# Patient Record
Sex: Female | Born: 1940 | Race: Black or African American | Hispanic: No | Smoking: Former smoker
Health system: Southern US, Community
[De-identification: ages and names within clinical notes are randomized; demographics above are authoritative.]

## PROBLEM LIST (undated history)

## (undated) DIAGNOSIS — F0390 Unspecified dementia without behavioral disturbance: Secondary | ICD-10-CM

## (undated) DIAGNOSIS — I10 Essential (primary) hypertension: Secondary | ICD-10-CM

---

## 2004-03-26 ENCOUNTER — Other Ambulatory Visit: Payer: Self-pay

## 2004-03-26 ENCOUNTER — Inpatient Hospital Stay: Payer: Self-pay | Admitting: Internal Medicine

## 2004-07-28 ENCOUNTER — Ambulatory Visit: Payer: Self-pay | Admitting: Unknown Physician Specialty

## 2004-08-08 ENCOUNTER — Ambulatory Visit: Payer: Self-pay | Admitting: Unknown Physician Specialty

## 2005-10-02 ENCOUNTER — Emergency Department: Payer: Self-pay | Admitting: Emergency Medicine

## 2006-03-01 ENCOUNTER — Ambulatory Visit: Payer: Self-pay | Admitting: Unknown Physician Specialty

## 2007-06-06 ENCOUNTER — Ambulatory Visit: Payer: Self-pay | Admitting: Unknown Physician Specialty

## 2008-11-30 ENCOUNTER — Ambulatory Visit: Payer: Self-pay | Admitting: Unknown Physician Specialty

## 2008-12-04 ENCOUNTER — Ambulatory Visit: Payer: Self-pay | Admitting: Unknown Physician Specialty

## 2008-12-21 ENCOUNTER — Ambulatory Visit: Payer: Self-pay | Admitting: Unknown Physician Specialty

## 2009-01-03 ENCOUNTER — Ambulatory Visit: Payer: Self-pay | Admitting: Unknown Physician Specialty

## 2009-07-03 ENCOUNTER — Ambulatory Visit: Payer: Self-pay | Admitting: Unknown Physician Specialty

## 2009-07-23 ENCOUNTER — Ambulatory Visit: Payer: Self-pay | Admitting: Surgery

## 2010-01-22 ENCOUNTER — Ambulatory Visit: Payer: Self-pay | Admitting: Surgery

## 2011-03-18 ENCOUNTER — Ambulatory Visit: Payer: Self-pay | Admitting: Unknown Physician Specialty

## 2011-08-20 ENCOUNTER — Ambulatory Visit: Payer: Self-pay | Admitting: Unknown Physician Specialty

## 2012-05-02 ENCOUNTER — Ambulatory Visit: Payer: Self-pay | Admitting: Physician Assistant

## 2012-07-22 ENCOUNTER — Ambulatory Visit: Payer: Self-pay | Admitting: Unknown Physician Specialty

## 2012-07-25 LAB — PATHOLOGY REPORT

## 2015-08-08 ENCOUNTER — Other Ambulatory Visit: Payer: Self-pay | Admitting: Family Medicine

## 2015-08-08 DIAGNOSIS — Z1231 Encounter for screening mammogram for malignant neoplasm of breast: Secondary | ICD-10-CM

## 2015-10-02 ENCOUNTER — Encounter: Payer: Self-pay | Admitting: Emergency Medicine

## 2015-10-02 ENCOUNTER — Emergency Department
Admission: EM | Admit: 2015-10-02 | Discharge: 2015-10-02 | Disposition: A | Discharge status: Home / Self Care | Payer: Medicare HMO | Attending: Emergency Medicine | Admitting: Emergency Medicine

## 2015-10-02 ENCOUNTER — Emergency Department: Payer: Medicare HMO

## 2015-10-02 DIAGNOSIS — R55 Syncope and collapse: Secondary | ICD-10-CM

## 2015-10-02 DIAGNOSIS — Z8679 Personal history of other diseases of the circulatory system: Secondary | ICD-10-CM | POA: Insufficient documentation

## 2015-10-02 DIAGNOSIS — Z7982 Long term (current) use of aspirin: Secondary | ICD-10-CM | POA: Diagnosis not present

## 2015-10-02 DIAGNOSIS — Z79899 Other long term (current) drug therapy: Secondary | ICD-10-CM | POA: Diagnosis not present

## 2015-10-02 DIAGNOSIS — I951 Orthostatic hypotension: Secondary | ICD-10-CM | POA: Insufficient documentation

## 2015-10-02 HISTORY — DX: Unspecified dementia, unspecified severity, without behavioral disturbance, psychotic disturbance, mood disturbance, and anxiety: F03.90

## 2015-10-02 HISTORY — DX: Essential (primary) hypertension: I10

## 2015-10-02 LAB — CBC
HEMATOCRIT: 40.3 % (ref 35.0–47.0)
Hemoglobin: 13.1 g/dL (ref 12.0–16.0)
MCH: 29.2 pg (ref 26.0–34.0)
MCHC: 32.4 g/dL (ref 32.0–36.0)
MCV: 90 fL (ref 80.0–100.0)
Platelets: 170 10*3/uL (ref 150–440)
RBC: 4.48 MIL/uL (ref 3.80–5.20)
RDW: 14.8 % — ABNORMAL HIGH (ref 11.5–14.5)
WBC: 6.6 10*3/uL (ref 3.6–11.0)

## 2015-10-02 LAB — URINALYSIS COMPLETE WITH MICROSCOPIC (ARMC ONLY)
BACTERIA UA: NONE SEEN
Bilirubin Urine: NEGATIVE
Glucose, UA: NEGATIVE mg/dL
HGB URINE DIPSTICK: NEGATIVE
Ketones, ur: NEGATIVE mg/dL
Nitrite: NEGATIVE
Protein, ur: 30 mg/dL — AB
SPECIFIC GRAVITY, URINE: 1.025 (ref 1.005–1.030)
pH: 5 (ref 5.0–8.0)

## 2015-10-02 LAB — BASIC METABOLIC PANEL
Anion gap: 7 (ref 5–15)
BUN: 27 mg/dL — ABNORMAL HIGH (ref 6–20)
CHLORIDE: 107 mmol/L (ref 101–111)
CO2: 23 mmol/L (ref 22–32)
Calcium: 8.9 mg/dL (ref 8.9–10.3)
Creatinine, Ser: 1.36 mg/dL — ABNORMAL HIGH (ref 0.44–1.00)
GFR calc non Af Amer: 38 mL/min — ABNORMAL LOW (ref >60)
GFR, EST AFRICAN AMERICAN: 44 mL/min — AB (ref >60)
Glucose, Bld: 118 mg/dL — ABNORMAL HIGH (ref 65–99)
POTASSIUM: 4.3 mmol/L (ref 3.5–5.1)
SODIUM: 137 mmol/L (ref 135–145)

## 2015-10-02 LAB — TROPONIN I: Troponin I: <0.03 ng/mL (ref <0.031)

## 2015-10-02 MED ORDER — SODIUM CHLORIDE 0.9 % IV SOLN
Freq: Once | INTRAVENOUS | Status: AC
  Administered 2015-10-02: 17:00:00 via INTRAVENOUS

## 2015-10-02 NOTE — ED Notes (Signed)
Patient presents to the ED with dizziness and syncopal episode. Patient states she has had issues with dizziness for several months.  Patient states earlier today she felt like she was going to pass out and she attempted to sit down, missed the chair and fell in the floor.  Patient states she did pass out today.  Patient's daughter heard a noise, entered the room and patient was lying on the floor.   Patient has had increased confusion over the past few weeks and has been falling more recently.  Patient has history of early stages of dementia.  Dr. Sherryll BurgerShah is patient's neurologist.

## 2015-10-02 NOTE — Discharge Instructions (Signed)
Orthostatic Hypotension °Orthostatic hypotension is a sudden drop in blood pressure. It happens when you quickly stand up from a seated or lying position. You may feel dizzy or light-headed. This can last for just a few seconds or for up to a few minutes. It is usually not a serious problem. However, if this happens frequently or gets worse, it can be a sign of something more serious. °CAUSES  °Different things can cause orthostatic hypotension, including:  °· Loss of body fluids (dehydration). °· Medicines that lower blood pressure. °· Sudden changes in posture, such as standing up quickly after you have been sitting or lying down. °· Taking too much of your medicine. °SIGNS AND SYMPTOMS  °· Light-headedness or dizziness.   °· Fainting or near-fainting.   °· A fast heart rate.   °· Weakness.   °· Feeling tired (fatigue).   °DIAGNOSIS  °Your health care provider may do several things to help diagnose your condition and identify the cause. These may include:  °· Taking a medical history and doing a physical exam. °· Checking your blood pressure. Your health care provider will check your blood pressure when you are: °· Lying down. °· Sitting. °· Standing. °· Using tilt table testing. In this test, you lie down on a table that moves from a lying position to a standing position. You will be strapped onto the table. This test monitors your blood pressure and heart rate when you are in different positions. °TREATMENT  °Treatment will vary depending on the cause. Possible treatments include:  °· Changing the dosage of your medicines.  °· Wearing compression stockings on your lower legs. °· Standing up slowly after sitting or lying down. °· Eating more salt. °· Eating frequent, small meals. °· In some cases, getting IV fluids. °· Taking medicine to enhance fluid retention. °HOME CARE INSTRUCTIONS °· Only take over-the-counter or prescription medicines as directed by your health care provider. °· Follow your health care  provider's instructions for changing the dosage of your current medicines. °· Do not stop or adjust your medicine on your own. °· Stand up slowly after sitting or lying down. This allows your body to adjust to the different position. °· Wear compression stockings as directed. °· Eat extra salt as directed. °· Do not add extra salt to your diet unless directed to by your health care provider. °· Eat frequent, small meals. °· Avoid standing suddenly after eating. °· Avoid hot showers or excessive heat as directed by your health care provider. °· Keep all follow-up appointments. °SEEK MEDICAL CARE IF: °· You continue to feel dizzy or light-headed after standing. °· You feel groggy or confused. °· You feel cold, clammy, or sick to your stomach (nauseous). °· You have blurred vision. °· You feel short of breath. °SEEK IMMEDIATE MEDICAL CARE IF:  °· You faint after standing. °· You have chest pain.  °· You have difficulty breathing.   °· You lose feeling or movement in your arms or legs.   °· You have slurred speech or difficulty talking, or you are unable to talk.   °MAKE SURE YOU:  °· Understand these instructions. °· Will watch your condition. °· Will get help right away if you are not doing well or get worse. °  °This information is not intended to replace advice given to you by your health care provider. Make sure you discuss any questions you have with your health care provider. °  °Document Released: 04/17/2002 Document Revised: 05/02/2013 Document Reviewed: 02/17/2013 °Elsevier Interactive Patient Education ©2016 [employer] ° °Syncope °Syncope   is a medical term for fainting or passing out. This means you lose consciousness and drop to the ground. People are generally unconscious for less than 5 minutes. You may have some muscle twitches for up to 15 seconds before waking up and returning to normal. Syncope occurs more often in older adults, but it can happen to anyone. While most causes of syncope are not  dangerous, syncope can be a sign of a serious medical problem. It is important to seek medical care.  °CAUSES  °Syncope is caused by a sudden drop in blood flow to the brain. The specific cause is often not determined. Factors that can bring on syncope include: °· Taking medicines that lower blood pressure. °· Sudden changes in posture, such as standing up quickly. °· Taking more medicine than prescribed. °· Standing in one place for too long. °· Seizure disorders. °· Dehydration and excessive exposure to heat. °· Low blood sugar (hypoglycemia). °· Straining to have a bowel movement. °· Heart disease, irregular heartbeat, or other circulatory problems. °· Fear, emotional distress, seeing blood, or severe pain. °SYMPTOMS  °Right before fainting, you may: °· Feel dizzy or light-headed. °· Feel nauseous. °· See all white or all black in your field of vision. °· Have cold, clammy skin. °DIAGNOSIS  °Your health care provider will ask about your symptoms, perform a physical exam, and perform an electrocardiogram (ECG) to record the electrical activity of your heart. Your health care provider may also perform other heart or blood tests to determine the cause of your syncope which may include: °· Transthoracic echocardiogram (TTE). During echocardiography, sound waves are used to evaluate how blood flows through your heart. °· Transesophageal echocardiogram (TEE). °· Cardiac monitoring. This allows your health care provider to monitor your heart rate and rhythm in real time. °· Holter monitor. This is a portable device that records your heartbeat and can help diagnose heart arrhythmias. It allows your health care provider to track your heart activity for several days, if needed. °· Stress tests by exercise or by giving medicine that makes the heart beat faster. °TREATMENT  °In most cases, no treatment is needed. Depending on the cause of your syncope, your health care provider may recommend changing or stopping some of your  medicines. °HOME CARE INSTRUCTIONS °· Have someone stay with you until you feel stable. °· Do not drive, use machinery, or play sports until your health care provider says it is okay. °· Keep all follow-up appointments as directed by your health care provider. °· Lie down right away if you start feeling like you might faint. Breathe deeply and steadily. Wait until all the symptoms have passed. °· Drink enough fluids to keep your urine clear or pale yellow. °· If you are taking blood pressure or heart medicine, get up slowly and take several minutes to sit and then stand. This can reduce dizziness. °SEEK IMMEDIATE MEDICAL CARE IF:  °· You have a severe headache. °· You have unusual pain in the chest, abdomen, or back. °· You are bleeding from your mouth or rectum, or you have black or tarry stool. °· You have an irregular or very fast heartbeat. °· You have pain with breathing. °· You have repeated fainting or seizure-like jerking during an episode. °· You faint when sitting or lying down. °· You have confusion. °· You have trouble walking. °· You have severe weakness. °· You have vision problems. °If you fainted, call your local emergency services (911 in [place] Do not drive yourself   to the hospital.  °  °This information is not intended to replace advice given to you by your health care provider. Make sure you discuss any questions you have with your health care provider. °  °Document Released: 04/27/2005 Document Revised: 09/11/2014 Document Reviewed: 06/26/2011 °Elsevier Interactive Patient Education ©2016 [employer] ° °

## 2015-10-02 NOTE — ED Provider Notes (Signed)
[facility] Emergency Department Provider Note        Time seen: ----------------------------------------- 4:30 PM on 10/02/2015 -----------------------------------------    I have reviewed the triage vital signs and the nursing notes.   HISTORY  Chief Complaint Near Syncope    HPI Sheena Mitchell is a 75 y.o. female who presents to ER for dizziness and syncopal episodes over the last 3 weeks. Patient states she's had issues with dizziness for a while, had an episode earlier today where she felt she was given a pass out where she attempted to sit down. She missed the chair and fell to the floor but denies any complaints. States she did pass out today. Her daughter heard a noise coming into the room she is lying on the floor. She is not worried about these events, patient was noted to have increased confusion over the past few weeks and has been falling recently. She does have a history of dementia and is in early stages. She is currently under the care of a neurologist.   Past Medical History  Diagnosis Date  . Dementia   . Hypertension     There are no active problems to display for this patient.   History reviewed. No pertinent past surgical history.  Allergies Review of patient's allergies indicates no known allergies.  Social History Social History  Substance Use Topics  . Smoking status: Never Smoker   . Smokeless tobacco: None  . Alcohol Use: No    Review of Systems Constitutional: Negative for fever. Eyes: Negative for visual changes. ENT: Negative for sore throat. Cardiovascular: Negative for chest pain. Respiratory: Negative for shortness of breath. Gastrointestinal: Negative for abdominal pain, vomiting and diarrhea. Genitourinary: Negative for dysuria. Musculoskeletal: Negative for back pain. Skin: Negative for rash. Neurological: Negative for headaches, focal weakness or numbness.  10-point ROS otherwise  negative.  ____________________________________________   PHYSICAL EXAM:  VITAL SIGNS: ED Triage Vitals  Enc Vitals Group     BP 10/02/15 1424 122/70 mmHg     Pulse Rate 10/02/15 1424 84     Resp 10/02/15 1424 18     Temp 10/02/15 1424 98.9 F (37.2 C)     Temp Source 10/02/15 1424 Oral     SpO2 10/02/15 1424 98 %     Weight 10/02/15 1424 220 lb (99.791 kg)     Height 10/02/15 1424 5\' 1"  (1.549 m)     Head Cir --      Peak Flow --      Pain Score 10/02/15 1427 0     Pain Loc --      Pain Edu? --      Excl. in GC? --     Constitutional: Alert and oriented. Well appearing and in no distress. Eyes: Conjunctivae are normal. PERRL. Normal extraocular movements. ENT   Head: Normocephalic and atraumatic.   Nose: No congestion/rhinnorhea.   Mouth/Throat: Mucous membranes are moist.   Neck: No stridor. Cardiovascular: Normal rate, regular rhythm. No murmurs, rubs, or gallops. Respiratory: Normal respiratory effort without tachypnea nor retractions. Breath sounds are clear and equal bilaterally. No wheezes/rales/rhonchi. Gastrointestinal: Soft and nontender. Normal bowel sounds Musculoskeletal: Nontender with normal range of motion in all extremities. No lower extremity tenderness nor edema. Neurologic:  Normal speech and language. No gross focal neurologic deficits are appreciated.  Skin:  Skin is warm, dry and intact. No rash noted. Psychiatric: Mood and affect are normal. Speech and behavior are normal.  ____________________________________________  EKG: Interpreted by  me. Sinus rhythm with a rate of 82 bpm, normal PR interval, normal QRS, normal QT interval, normal axis. Nonspecific T-wave changes  ____________________________________________  ED COURSE:  Pertinent labs & imaging results that were available during my care of the patient were reviewed by me and considered in my medical decision making (see chart for details). Patient resents to ER with near  syncopal events. Patient is sitting comfortably on the side of the bed speaking to me and does not have any concerns about the symptoms. Orthostatics reveal significant drop in blood pressure from sitting to standing. We will give IV fluid, check basic labs and reevaluate. ____________________________________________    LABS (pertinent positives/negatives)  Labs Reviewed  BASIC METABOLIC PANEL - Abnormal; Notable for the following:    Glucose, Bld 118 (*)    BUN 27 (*)    Creatinine, Ser 1.36 (*)    GFR calc non Af Amer 38 (*)    GFR calc Af Amer 44 (*)    All other components within normal limits  CBC - Abnormal; Notable for the following:    RDW 14.8 (*)    All other components within normal limits  URINALYSIS COMPLETEWITH MICROSCOPIC ([facility] ONLY) - Abnormal; Notable for the following:    Color, Urine AMBER (*)    APPearance CLEAR (*)    Protein, ur 30 (*)    Leukocytes, UA TRACE (*)    Squamous Epithelial / LPF 0-5 (*)    All other components within normal limits  TROPONIN I   RADIOLOGY: IMPRESSION: No evidence of intracranial injury or fracture.  ____________________________________________  FINAL ASSESSMENT AND PLAN  Orthostatic hypotension, syncope  Plan: Patient with labs as dictated above. Patient is currently asymptomatic, feels better with standing after 1 L of saline. I I will advise her to stop her Cozaar and follow-up with her doctor in a week. She apparently was having orthostatic episodes. I did offer her admission but she has declined at this time.   Emily Filbert, MD   Note: This dictation was prepared with Dragon dictation. Any transcriptional errors that result from this process are unintentional   Emily Filbert, MD 10/02/15 [phone]

## 2015-10-09 ENCOUNTER — Emergency Department
Admission: EM | Admit: 2015-10-09 | Discharge: 2015-10-09 | Disposition: A | Discharge status: Home / Self Care | Payer: Medicare HMO | Attending: Emergency Medicine | Admitting: Emergency Medicine

## 2015-10-09 ENCOUNTER — Encounter: Payer: Self-pay | Admitting: Emergency Medicine

## 2015-10-09 ENCOUNTER — Emergency Department: Payer: Medicare HMO

## 2015-10-09 DIAGNOSIS — Z87891 Personal history of nicotine dependence: Secondary | ICD-10-CM | POA: Diagnosis not present

## 2015-10-09 DIAGNOSIS — Z7982 Long term (current) use of aspirin: Secondary | ICD-10-CM | POA: Insufficient documentation

## 2015-10-09 DIAGNOSIS — R270 Ataxia, unspecified: Secondary | ICD-10-CM | POA: Insufficient documentation

## 2015-10-09 DIAGNOSIS — I10 Essential (primary) hypertension: Secondary | ICD-10-CM | POA: Diagnosis not present

## 2015-10-09 DIAGNOSIS — R269 Unspecified abnormalities of gait and mobility: Secondary | ICD-10-CM | POA: Diagnosis present

## 2015-10-09 DIAGNOSIS — Z79899 Other long term (current) drug therapy: Secondary | ICD-10-CM | POA: Diagnosis not present

## 2015-10-09 DIAGNOSIS — R531 Weakness: Secondary | ICD-10-CM

## 2015-10-09 DIAGNOSIS — N390 Urinary tract infection, site not specified: Secondary | ICD-10-CM | POA: Diagnosis not present

## 2015-10-09 LAB — URINALYSIS COMPLETE WITH MICROSCOPIC (ARMC ONLY)
Bilirubin Urine: NEGATIVE
Glucose, UA: NEGATIVE mg/dL
HGB URINE DIPSTICK: NEGATIVE
KETONES UR: NEGATIVE mg/dL
NITRITE: NEGATIVE
PROTEIN: NEGATIVE mg/dL
SPECIFIC GRAVITY, URINE: 1.023 (ref 1.005–1.030)
pH: 5 (ref 5.0–8.0)

## 2015-10-09 LAB — BASIC METABOLIC PANEL
ANION GAP: 8 (ref 5–15)
BUN: 25 mg/dL — AB (ref 6–20)
CHLORIDE: 106 mmol/L (ref 101–111)
CO2: 24 mmol/L (ref 22–32)
Calcium: 9.4 mg/dL (ref 8.9–10.3)
Creatinine, Ser: 1.39 mg/dL — ABNORMAL HIGH (ref 0.44–1.00)
GFR, EST AFRICAN AMERICAN: 43 mL/min — AB (ref >60)
GFR, EST NON AFRICAN AMERICAN: 37 mL/min — AB (ref >60)
Glucose, Bld: 95 mg/dL (ref 65–99)
POTASSIUM: 4 mmol/L (ref 3.5–5.1)
SODIUM: 138 mmol/L (ref 135–145)

## 2015-10-09 LAB — CBC
HCT: 39.7 % (ref 35.0–47.0)
HEMOGLOBIN: 13.3 g/dL (ref 12.0–16.0)
MCH: 29.3 pg (ref 26.0–34.0)
MCHC: 33.5 g/dL (ref 32.0–36.0)
MCV: 87.5 fL (ref 80.0–100.0)
Platelets: 165 10*3/uL (ref 150–440)
RBC: 4.54 MIL/uL (ref 3.80–5.20)
RDW: 14.7 % — ABNORMAL HIGH (ref 11.5–14.5)
WBC: 6.3 10*3/uL (ref 3.6–11.0)

## 2015-10-09 LAB — TROPONIN I

## 2015-10-09 MED ORDER — CEPHALEXIN 500 MG PO CAPS
500.0000 mg | ORAL_CAPSULE | Freq: Once | ORAL | Status: AC
  Administered 2015-10-09: 500 mg via ORAL
  Filled 2015-10-09: qty 1

## 2015-10-09 MED ORDER — CEPHALEXIN 500 MG PO CAPS: 500.0000 mg | ORAL_CAPSULE | Freq: Three times a day (TID) | ORAL | Status: DC

## 2015-10-09 NOTE — ED Notes (Signed)
NAD noted at time of D/C. Pt denies questions or concerns. Pt taken to the lobby via wheelchair at this time with her sister in law.

## 2015-10-09 NOTE — Discharge Instructions (Signed)

## 2015-10-09 NOTE — ED Notes (Signed)
Pt was seen here on 5/24 for unsteady gait and family states they were told her gait was coming from her BP medication. Family states she is still having unsteady gait. Family states pt has dementia and recently she has been unable to dress or feed self without assistance.

## 2015-10-09 NOTE — ED Notes (Signed)
Pt returned from MRI at this time

## 2015-10-09 NOTE — ED Notes (Signed)
Helped pt to toilet.  Pt ambulated with minimal assistance.

## 2015-10-09 NOTE — ED Notes (Signed)
MD to bedside at this time 

## 2015-10-09 NOTE — ED Provider Notes (Signed)
[facility] Emergency Department Provider Note  Time seen: 4:22 PM  I have reviewed the triage vital signs and the nursing notes.   HISTORY  Chief Complaint Gait Problem    HPI Sheena Mitchell is a 75 y.o. female with a past medical history of dementia, hypertension, presents the emergency department with difficulty ambulating and performing daily skills. According to the patient and her daughter the patient was seen in the emergency department 1 week ago after a fall. States the patient rarely falls. States since coming home from that visit the patient has not been able to walk on her own, has not able to perform her daily activities. Daughter states this is a big change from the patient's baseline prior to one week ago. Denies any history of stroke. Patient denies any pain. Patient does have dementia but can answer questions appropriately. The patient denies headache or focal weakness/numbness.     Past Medical History  Diagnosis Date  . Dementia   . Hypertension     There are no active problems to display for this patient.   History reviewed. No pertinent past surgical history.  Current Outpatient Rx  Name  Route  Sig  Dispense  Refill  . aspirin EC 81 MG tablet   Oral   Take 81 mg by mouth daily.         Marland Kitchen. atorvastatin (LIPITOR) 10 MG tablet   Oral   Take 10 mg by mouth at bedtime.         Marland Kitchen. FLUoxetine (PROZAC) 10 MG capsule   Oral   Take 10 mg by mouth daily.         Marland Kitchen. galantamine (RAZADYNE) 12 MG tablet   Oral   Take 12 mg by mouth 2 (two) times daily.         Marland Kitchen. losartan (COZAAR) 25 MG tablet   Oral   Take 25 mg by mouth daily.         . memantine (NAMENDA) 10 MG tablet   Oral   Take 10 mg by mouth 2 (two) times daily.           Allergies Review of patient's allergies indicates no known allergies.  History reviewed. No pertinent family history.  Social History Social History  Substance Use Topics  . Smoking  status: Former Games developermoker  . Smokeless tobacco: None  . Alcohol Use: No    Review of Systems Constitutional: Negative for fever Cardiovascular: Negative for chest pain. Respiratory: Negative for shortness of breath. Gastrointestinal: Negative for abdominal pain Musculoskeletal: Negative for back pain. Neurological: Negative for headaches, focal weakness or numbness.  10-point ROS otherwise negative.  ____________________________________________   PHYSICAL EXAM:  VITAL SIGNS: ED Triage Vitals  Enc Vitals Group     BP 10/09/15 1518 120/94 mmHg     Pulse Rate 10/09/15 1518 75     Resp 10/09/15 1518 20     Temp 10/09/15 1518 98.9 F (37.2 C)     Temp Source 10/09/15 1518 Oral     SpO2 10/09/15 1518 100 %     Weight 10/09/15 1518 220 lb (99.791 kg)     Height 10/09/15 1518 5\' 2"  (1.575 m)     Head Cir --      Peak Flow --      Pain Score 10/09/15 1518 0     Pain Loc --      Pain Edu? --      Excl. in GC? --  Constitutional: Alert. Well appearing and in no distress. Eyes: Normal exam ENT   Head: Normocephalic and atraumatic   Mouth/Throat: Mucous membranes are moist. Cardiovascular: Normal rate, regular rhythm.  Respiratory: Normal respiratory effort without tachypnea nor retractions. Breath sounds are clear  Gastrointestinal: Soft and nontender. No distention.   Musculoskeletal: Nontender with normal range of motion in all extremities.  Neurologic:  Normal speech and language. No gross focal neurologic deficits Skin:  Skin is warm, dry and intact.  Psychiatric: Mood and affect are normal.   ____________________________________________    RADIOLOGY  MRI of the brain shows no acute abnormality.  ____________________________________________   INITIAL IMPRESSION / ASSESSMENT AND PLAN / ED COURSE  Pertinent labs & imaging results that were available during my care of the patient were reviewed by me and considered in my medical decision making (see chart for  details).  Patient with mild dementia presents the emergency department with difficulty ambulating and difficulty performing her daily living activities for the past one week. On exam the patient does have a slight right facial droop however according to the daughter it looks normal. On exam the patient does appear to have a very slight right pronator drift. Otherwise the patient appears well, nontender exam, no distress. We'll obtain an MRI of the patient's brain given the possible right-sided deficits on exam. I reviewed the patient's workup from 5/24 including labs and a CT which were overall normal. Patient has a neurologist Dr. Sherryll Burger with whom she follows up for dementia.  MRI is negative. Labs within normal limits. Patient continues to appear well in the emergency department. I have had a long discussion with the family, they wish for the patient to be discharged home, they're currently working with her primary care physician regarding home health and possible physical therapy. I discussed with our social worker who unfortunately was at a different site this evening, she took down the patient's information and will have the case manager call the patient more morning to help arrange home health for the patient. We will discharge the patient home at this time.  ____________________________________________   FINAL CLINICAL IMPRESSION(S) / ED DIAGNOSES  Ataxia   Minna Antis, MD 10/09/15 [phone]

## 2015-10-11 LAB — URINE CULTURE

## 2015-12-31 ENCOUNTER — Ambulatory Visit
Admission: RE | Admit: 2015-12-31 | Discharge: 2015-12-31 | Disposition: A | Discharge status: Home / Self Care | Payer: Medicare HMO | Source: Ambulatory Visit | Attending: Family Medicine | Admitting: Family Medicine

## 2015-12-31 ENCOUNTER — Other Ambulatory Visit: Payer: Self-pay | Admitting: Family Medicine

## 2015-12-31 DIAGNOSIS — Z1231 Encounter for screening mammogram for malignant neoplasm of breast: Secondary | ICD-10-CM | POA: Insufficient documentation

## 2017-01-12 ENCOUNTER — Other Ambulatory Visit: Payer: Self-pay | Admitting: Internal Medicine

## 2017-01-12 DIAGNOSIS — Z1231 Encounter for screening mammogram for malignant neoplasm of breast: Secondary | ICD-10-CM

## 2017-02-01 ENCOUNTER — Ambulatory Visit
Admission: RE | Admit: 2017-02-01 | Discharge: 2017-02-01 | Disposition: A | Discharge status: Home / Self Care | Payer: Medicare (Managed Care) | Source: Ambulatory Visit | Attending: Internal Medicine | Admitting: Internal Medicine

## 2017-02-01 DIAGNOSIS — Z1231 Encounter for screening mammogram for malignant neoplasm of breast: Secondary | ICD-10-CM | POA: Diagnosis present

## 2017-04-26 ENCOUNTER — Other Ambulatory Visit: Payer: Self-pay | Admitting: Internal Medicine

## 2017-04-26 DIAGNOSIS — Z1231 Encounter for screening mammogram for malignant neoplasm of breast: Secondary | ICD-10-CM

## 2017-07-19 ENCOUNTER — Other Ambulatory Visit: Payer: Self-pay | Admitting: Internal Medicine

## 2017-07-19 DIAGNOSIS — R4182 Altered mental status, unspecified: Secondary | ICD-10-CM

## 2017-08-05 ENCOUNTER — Ambulatory Visit
Admission: RE | Admit: 2017-08-05 | Discharge: 2017-08-05 | Disposition: A | Discharge status: Home / Self Care | Payer: Medicare (Managed Care) | Source: Ambulatory Visit | Attending: Internal Medicine | Admitting: Internal Medicine

## 2017-08-05 DIAGNOSIS — G319 Degenerative disease of nervous system, unspecified: Secondary | ICD-10-CM | POA: Diagnosis not present

## 2017-08-05 DIAGNOSIS — R4182 Altered mental status, unspecified: Secondary | ICD-10-CM | POA: Diagnosis present

## 2017-11-16 ENCOUNTER — Emergency Department: Payer: Medicare (Managed Care)

## 2017-11-16 ENCOUNTER — Inpatient Hospital Stay: Payer: Medicare (Managed Care)

## 2017-11-16 ENCOUNTER — Inpatient Hospital Stay
Admission: EM | Admit: 2017-11-16 | Discharge: 2017-11-26 | DRG: 871 | Disposition: A | Discharge status: Skilled Nursing Facility | Payer: Medicare (Managed Care) | Attending: Internal Medicine | Admitting: Internal Medicine

## 2017-11-16 DIAGNOSIS — N183 Chronic kidney disease, stage 3 (moderate): Secondary | ICD-10-CM | POA: Diagnosis present

## 2017-11-16 DIAGNOSIS — E785 Hyperlipidemia, unspecified: Secondary | ICD-10-CM | POA: Diagnosis present

## 2017-11-16 DIAGNOSIS — Z515 Encounter for palliative care: Secondary | ICD-10-CM | POA: Diagnosis not present

## 2017-11-16 DIAGNOSIS — N179 Acute kidney failure, unspecified: Secondary | ICD-10-CM | POA: Diagnosis present

## 2017-11-16 DIAGNOSIS — R7989 Other specified abnormal findings of blood chemistry: Secondary | ICD-10-CM

## 2017-11-16 DIAGNOSIS — R945 Abnormal results of liver function studies: Secondary | ICD-10-CM | POA: Diagnosis present

## 2017-11-16 DIAGNOSIS — A419 Sepsis, unspecified organism: Principal | ICD-10-CM

## 2017-11-16 DIAGNOSIS — R410 Disorientation, unspecified: Secondary | ICD-10-CM | POA: Diagnosis not present

## 2017-11-16 DIAGNOSIS — Z79899 Other long term (current) drug therapy: Secondary | ICD-10-CM

## 2017-11-16 DIAGNOSIS — G9340 Encephalopathy, unspecified: Secondary | ICD-10-CM | POA: Diagnosis not present

## 2017-11-16 DIAGNOSIS — G9341 Metabolic encephalopathy: Secondary | ICD-10-CM | POA: Diagnosis present

## 2017-11-16 DIAGNOSIS — R4182 Altered mental status, unspecified: Secondary | ICD-10-CM | POA: Diagnosis not present

## 2017-11-16 DIAGNOSIS — R627 Adult failure to thrive: Secondary | ICD-10-CM

## 2017-11-16 DIAGNOSIS — Z6837 Body mass index (BMI) 37.0-37.9, adult: Secondary | ICD-10-CM

## 2017-11-16 DIAGNOSIS — Z66 Do not resuscitate: Secondary | ICD-10-CM | POA: Diagnosis present

## 2017-11-16 DIAGNOSIS — I129 Hypertensive chronic kidney disease with stage 1 through stage 4 chronic kidney disease, or unspecified chronic kidney disease: Secondary | ICD-10-CM | POA: Diagnosis present

## 2017-11-16 DIAGNOSIS — R509 Fever, unspecified: Secondary | ICD-10-CM

## 2017-11-16 DIAGNOSIS — R569 Unspecified convulsions: Secondary | ICD-10-CM | POA: Diagnosis not present

## 2017-11-16 DIAGNOSIS — J9602 Acute respiratory failure with hypercapnia: Secondary | ICD-10-CM | POA: Diagnosis not present

## 2017-11-16 DIAGNOSIS — R7309 Other abnormal glucose: Secondary | ICD-10-CM | POA: Diagnosis not present

## 2017-11-16 DIAGNOSIS — J9601 Acute respiratory failure with hypoxia: Secondary | ICD-10-CM | POA: Diagnosis not present

## 2017-11-16 DIAGNOSIS — Z634 Disappearance and death of family member: Secondary | ICD-10-CM

## 2017-11-16 DIAGNOSIS — Z87891 Personal history of nicotine dependence: Secondary | ICD-10-CM

## 2017-11-16 DIAGNOSIS — Z800 Family history of malignant neoplasm of digestive organs: Secondary | ICD-10-CM

## 2017-11-16 DIAGNOSIS — J690 Pneumonitis due to inhalation of food and vomit: Secondary | ICD-10-CM | POA: Diagnosis not present

## 2017-11-16 DIAGNOSIS — F0390 Unspecified dementia without behavioral disturbance: Secondary | ICD-10-CM | POA: Diagnosis present

## 2017-11-16 DIAGNOSIS — K8020 Calculus of gallbladder without cholecystitis without obstruction: Secondary | ICD-10-CM | POA: Diagnosis present

## 2017-11-16 DIAGNOSIS — R062 Wheezing: Secondary | ICD-10-CM

## 2017-11-16 DIAGNOSIS — Z7982 Long term (current) use of aspirin: Secondary | ICD-10-CM

## 2017-11-16 DIAGNOSIS — R0602 Shortness of breath: Secondary | ICD-10-CM

## 2017-11-16 LAB — URINALYSIS, COMPLETE (UACMP) WITH MICROSCOPIC
Bilirubin Urine: NEGATIVE
GLUCOSE, UA: NEGATIVE mg/dL
Ketones, ur: 5 mg/dL — AB
LEUKOCYTES UA: NEGATIVE
NITRITE: NEGATIVE
PH: 5 (ref 5.0–8.0)
Protein, ur: 30 mg/dL — AB
SPECIFIC GRAVITY, URINE: 1.019 (ref 1.005–1.030)

## 2017-11-16 LAB — LACTIC ACID, PLASMA
LACTIC ACID, VENOUS: 0.9 mmol/L (ref 0.5–1.9)
LACTIC ACID, VENOUS: 0.6 mmol/L (ref 0.5–1.9)

## 2017-11-16 LAB — URINE DRUG SCREEN, QUALITATIVE (ARMC ONLY)
AMPHETAMINES, UR SCREEN: NOT DETECTED
Benzodiazepine, Ur Scrn: NOT DETECTED
CANNABINOID 50 NG, UR ~~LOC~~: NOT DETECTED
Cocaine Metabolite,Ur ~~LOC~~: NOT DETECTED
MDMA (ECSTASY) UR SCREEN: NOT DETECTED
METHADONE SCREEN, URINE: NOT DETECTED
Opiate, Ur Screen: NOT DETECTED
Phencyclidine (PCP) Ur S: NOT DETECTED
TRICYCLIC, UR SCREEN: NOT DETECTED

## 2017-11-16 LAB — COMPREHENSIVE METABOLIC PANEL
ALT: 316 U/L — AB (ref 0–44)
AST: 142 U/L — ABNORMAL HIGH (ref 15–41)
Albumin: 3.1 g/dL — ABNORMAL LOW (ref 3.5–5.0)
Alkaline Phosphatase: 158 U/L — ABNORMAL HIGH (ref 38–126)
Anion gap: 9 (ref 5–15)
BUN: 32 mg/dL — ABNORMAL HIGH (ref 8–23)
CO2: 23 mmol/L (ref 22–32)
CREATININE: 1.56 mg/dL — AB (ref 0.44–1.00)
Calcium: 8.9 mg/dL (ref 8.9–10.3)
Chloride: 108 mmol/L (ref 98–111)
GFR, EST AFRICAN AMERICAN: 37 mL/min — AB (ref >60)
GFR, EST NON AFRICAN AMERICAN: 32 mL/min — AB (ref >60)
Glucose, Bld: 91 mg/dL (ref 70–99)
POTASSIUM: 3.9 mmol/L (ref 3.5–5.1)
Sodium: 140 mmol/L (ref 135–145)
Total Bilirubin: 2.9 mg/dL — ABNORMAL HIGH (ref 0.3–1.2)
Total Protein: 7.6 g/dL (ref 6.5–8.1)

## 2017-11-16 LAB — CBC WITH DIFFERENTIAL/PLATELET
BASOS ABS: 0 10*3/uL (ref 0–0.1)
BASOS PCT: 0 %
Eosinophils Absolute: 0 10*3/uL (ref 0–0.7)
Eosinophils Relative: 0 %
HCT: 37.1 % (ref 35.0–47.0)
Hemoglobin: 12.4 g/dL (ref 12.0–16.0)
Lymphocytes Relative: 9 %
Lymphs Abs: 0.5 10*3/uL — ABNORMAL LOW (ref 1.0–3.6)
MCH: 30 pg (ref 26.0–34.0)
MCHC: 33.6 g/dL (ref 32.0–36.0)
MCV: 89.3 fL (ref 80.0–100.0)
MONO ABS: 0.4 10*3/uL (ref 0.2–0.9)
Monocytes Relative: 8 %
Neutro Abs: 4.3 10*3/uL (ref 1.4–6.5)
Neutrophils Relative %: 83 %
PLATELETS: 131 10*3/uL — AB (ref 150–440)
RBC: 4.15 MIL/uL (ref 3.80–5.20)
RDW: 15.2 % — AB (ref 11.5–14.5)
WBC: 5.2 10*3/uL (ref 3.6–11.0)

## 2017-11-16 LAB — PROTIME-INR
INR: 1.14
PROTHROMBIN TIME: 14.5 s (ref 11.4–15.2)

## 2017-11-16 LAB — INFLUENZA PANEL BY PCR (TYPE A & B)
INFLBPCR: NEGATIVE
Influenza A By PCR: NEGATIVE

## 2017-11-16 LAB — TROPONIN I: Troponin I: <0.03 ng/mL (ref <0.03)

## 2017-11-16 MED ORDER — ATORVASTATIN CALCIUM 10 MG PO TABS: 10.0000 mg | ORAL_TABLET | Freq: Every day | ORAL | Status: DC

## 2017-11-16 MED ORDER — VANCOMYCIN HCL IN DEXTROSE 1-5 GM/200ML-% IV SOLN
1000.0000 mg | Freq: Once | INTRAVENOUS | Status: AC
  Administered 2017-11-16: 1000 mg via INTRAVENOUS
  Filled 2017-11-16: qty 200

## 2017-11-16 MED ORDER — MEMANTINE HCL 5 MG PO TABS
10.0000 mg | ORAL_TABLET | Freq: Two times a day (BID) | ORAL | Status: DC
  Administered 2017-11-17 – 2017-11-26 (×2): 10 mg via ORAL
  Filled 2017-11-16: qty 2
  Filled 2017-11-16 (×2): qty 1
  Filled 2017-11-16: qty 2
  Filled 2017-11-16: qty 1
  Filled 2017-11-16: qty 2
  Filled 2017-11-16 (×2): qty 1

## 2017-11-16 MED ORDER — ACETAMINOPHEN 325 MG PO TABS: 650.0000 mg | ORAL_TABLET | Freq: Four times a day (QID) | ORAL | Status: DC | PRN

## 2017-11-16 MED ORDER — ADULT MULTIVITAMIN W/MINERALS CH
ORAL_TABLET | Freq: Every day | ORAL | Status: DC
  Administered 2017-11-17 – 2017-11-26 (×2): 1 via ORAL
  Filled 2017-11-16 (×4): qty 1

## 2017-11-16 MED ORDER — SODIUM CHLORIDE 0.9 % IV SOLN
INTRAVENOUS | Status: DC
  Administered 2017-11-16 – 2017-11-18 (×4): via INTRAVENOUS

## 2017-11-16 MED ORDER — ASPIRIN EC 81 MG PO TBEC
81.0000 mg | DELAYED_RELEASE_TABLET | Freq: Every day | ORAL | Status: DC
  Administered 2017-11-17 – 2017-11-26 (×2): 81 mg via ORAL
  Filled 2017-11-16 (×4): qty 1

## 2017-11-16 MED ORDER — DOCUSATE SODIUM 100 MG PO CAPS: 100.0000 mg | ORAL_CAPSULE | Freq: Two times a day (BID) | ORAL | Status: DC | PRN

## 2017-11-16 MED ORDER — QUETIAPINE FUMARATE 25 MG PO TABS
25.0000 mg | ORAL_TABLET | Freq: Every day | ORAL | Status: DC
  Administered 2017-11-18: 25 mg via ORAL
  Filled 2017-11-16 (×2): qty 1

## 2017-11-16 MED ORDER — SODIUM CHLORIDE 0.9 % IV BOLUS
1000.0000 mL | Freq: Once | INTRAVENOUS | Status: AC
  Administered 2017-11-16: 1000 mL via INTRAVENOUS

## 2017-11-16 MED ORDER — PIPERACILLIN-TAZOBACTAM 3.375 G IVPB 30 MIN
3.3750 g | Freq: Once | INTRAVENOUS | Status: AC
  Administered 2017-11-16: 3.375 g via INTRAVENOUS
  Filled 2017-11-16: qty 50

## 2017-11-16 MED ORDER — ACETAMINOPHEN 650 MG RE SUPP
650.0000 mg | Freq: Once | RECTAL | Status: AC
  Administered 2017-11-16: 650 mg via RECTAL
  Filled 2017-11-16: qty 1

## 2017-11-16 MED ORDER — IPRATROPIUM-ALBUTEROL 0.5-2.5 (3) MG/3ML IN SOLN
3.0000 mL | RESPIRATORY_TRACT | Status: DC
  Administered 2017-11-16 – 2017-11-19 (×14): 3 mL via RESPIRATORY_TRACT
  Filled 2017-11-16 (×14): qty 3

## 2017-11-16 MED ORDER — HEPARIN SODIUM (PORCINE) 5000 UNIT/ML IJ SOLN
5000.0000 [IU] | Freq: Three times a day (TID) | INTRAMUSCULAR | Status: DC
  Administered 2017-11-17 – 2017-11-19 (×7): 5000 [IU] via SUBCUTANEOUS
  Filled 2017-11-16 (×6): qty 1

## 2017-11-16 NOTE — H&P (Signed)
[employer] [facility] [facility]   PATIENT NAME: Sheena Mitchell    MR#:  [id number]  DATE OF BIRTH:  10/21/1940  DATE OF ADMISSION:  11/16/2017  PRIMARY CARE PHYSICIAN: [facility], [facility]   REQUESTING/REFERRING PHYSICIAN: [email or web address]   CHIEF COMPLAINT:   Chief Complaint  Patient presents with  . Altered Mental Status  . Urinary Tract Infection    HISTORY OF PRESENT ILLNESS: Sheena Mitchell  is a 77 y.o. female with a known history of dementia and hypertension- daughter was her care taker at home.  For last few days daughter had some flulike symptoms as per patient's family members in the room.  Patient also had diarrhea and progressive weakness for last 3 to 4 days.  She was [facility] yesterday and she had a home visiting nurse. Nurse went at home today and nobody opened the door.  So the nurse called police, they entered the house and found patient's daughter dead, and patient laying face down with unresponsive. EMS was called in and they noted patient running high-grade fever and they brought to the emergency room. Patient's vitals are stable and she is minimally responsive to stimuli and opens eyes, but does not make good communication.  Although initial work-up for infection is negative.  ER physician started on broad-spectrum IV antibiotics and gave to hospitalist team for further management of sepsis.  PAST MEDICAL HISTORY:   Past Medical History:  Diagnosis Date  . Dementia   . Hypertension     PAST SURGICAL HISTORY: History reviewed. No pertinent surgical history.  SOCIAL HISTORY:  Social History   Tobacco Use  . Smoking status: Former Smoker  Substance Use Topics  . Alcohol use: No    FAMILY HISTORY:  Family History  Problem Relation Age of Onset  . Colon cancer Mother     DRUG ALLERGIES: No Known Allergies  REVIEW OF SYSTEMS:   Terminal dementia, so can not give ROS.  MEDICATIONS AT HOME:  Prior to  Admission medications   Medication Sig Start Date End Date Taking? Authorizing Provider  aspirin EC 81 MG tablet Take 81 mg by mouth daily.   Yes [provider]  atorvastatin (LIPITOR) 10 MG tablet Take 10 mg by mouth at bedtime.   Yes [provider]  Cholecalciferol (D3-1000) 1000 units tablet Take 1,000 Units by mouth daily.   Yes [provider]  cyanocobalamin 1000 MCG tablet Take 1,000 mcg by mouth daily.   Yes [provider]  galantamine (RAZADYNE) 12 MG tablet Take 12 mg by mouth 2 (two) times daily.   Yes [provider]  memantine (NAMENDA) 10 MG tablet Take 10 mg by mouth 2 (two) times daily.   Yes [provider]  Multiple Vitamins-Minerals (CERTAVITE SENIOR/ANTIOXIDANT) TABS Take 1 tablet by mouth daily.   Yes [provider]  QUEtiapine (SEROQUEL) 25 MG tablet Take 25 mg by mouth daily with supper.   Yes [provider]      PHYSICAL EXAMINATION:   VITAL SIGNS: Blood pressure (!) 106/91, pulse 85, temperature (!) 103.6 F (39.8 C), resp. rate 20, height 5\' 5"  (1.651 m), weight 113.4 kg (250 lb), SpO2 100 %.  GENERAL:  77 y.o.-year-old patient lying in the bed with no acute distress.  EYES: Pupils 2 to 3 mm bilateral equal, round, reactive to light . No scleral icterus. Extraocular muscles intact.  HEENT: Head atraumatic, normocephalic. Oropharynx and nasopharynx clear.  Dry mouth. NECK:  Supple,  no jugular venous distention. No thyroid enlargement, no tenderness.  LUNGS: Normal breath sounds bilaterally, some wheezing, no crepitation. No use of accessory muscles of respiration.  CARDIOVASCULAR: S1, S2 normal. No murmurs, rubs, or gallops.  ABDOMEN: Soft, nontender, nondistended. Bowel sounds present. No organomegaly or mass.  EXTREMITIES: No pedal edema, cyanosis, or clubbing.  NEUROLOGIC: Cranial nerves II through XII are intact. Muscle strength 2-3/5 in all extremities.  Does not follow commands very  well.  Gait not checked.  PSYCHIATRIC: The patient is lethargic but opens her eyes to stimuli, does not communicate well and does not follow commands. SKIN: No obvious rash, lesion, or ulcer.   LABORATORY PANEL:   CBC Recent Labs  Lab 11/16/17 1657  WBC 5.2  HGB 12.4  HCT 37.1  PLT 131*  MCV 89.3  MCH 30.0  MCHC 33.6  RDW 15.2*  LYMPHSABS 0.5*  MONOABS 0.4  EOSABS 0.0  BASOSABS 0.0   ------------------------------------------------------------------------------------------------------------------  Chemistries  Recent Labs  Lab 11/16/17 1657  NA 140  K 3.9  CL 108  CO2 23  GLUCOSE 91  BUN 32*  CREATININE 1.56*  CALCIUM 8.9  AST 142*  ALT 316*  ALKPHOS 158*  BILITOT 2.9*   ------------------------------------------------------------------------------------------------------------------ estimated creatinine clearance is 39.8 mL/min (A) (by C-G formula based on SCr of 1.56 mg/dL (H)). ------------------------------------------------------------------------------------------------------------------ No results for input(s): TSH, T4TOTAL, T3FREE, THYROIDAB in the last 72 hours.  Invalid input(s): FREET3   Coagulation profile No results for input(s): INR, PROTIME in the last 168 hours. ------------------------------------------------------------------------------------------------------------------- No results for input(s): DDIMER in the last 72 hours. -------------------------------------------------------------------------------------------------------------------  Cardiac Enzymes No results for input(s): CKMB, TROPONINI, MYOGLOBIN in the last 168 hours.  Invalid input(s): CK ------------------------------------------------------------------------------------------------------------------ Invalid input(s): POCBNP  ---------------------------------------------------------------------------------------------------------------  Urinalysis    Component Value  Date/Time   COLORURINE AMBER (A) 11/16/2017 1648   APPEARANCEUR HAZY (A) 11/16/2017 1648   LABSPEC 1.019 11/16/2017 1648   PHURINE 5.0 11/16/2017 1648   GLUCOSEU NEGATIVE 11/16/2017 1648   HGBUR SMALL (A) 11/16/2017 1648   BILIRUBINUR NEGATIVE 11/16/2017 1648   KETONESUR 5 (A) 11/16/2017 1648   PROTEINUR 30 (A) 11/16/2017 1648   NITRITE NEGATIVE 11/16/2017 1648   LEUKOCYTESUR NEGATIVE 11/16/2017 1648     RADIOLOGY: Dg Chest Port 1 View  Result Date: 11/16/2017 CLINICAL DATA:  77 y/o F; altered mental status. Abnormal breath sounds. Sepsis. EXAM: PORTABLE CHEST 1 VIEW COMPARISON:  None. FINDINGS: Normal cardiac silhouette. Aortic atherosclerosis with calcification. Mild bronchitic changes. No focal consolidation no pleural effusion or pneumothorax. No acute osseous abnormality is evident. IMPRESSION: Mild bronchitic changes. No focal consolidation. Aortic atherosclerosis. Electronically Signed   By: Mitzi Hansen M.D.   On: 11/16/2017 17:27   US Abdomen Limited Ruq  Result Date: 11/16/2017 CLINICAL DATA:  Elevated liver function test with sepsis. EXAM: ULTRASOUND ABDOMEN LIMITED RIGHT UPPER QUADRANT COMPARISON:  None. FINDINGS: Gallbladder: The gallbladder is physiologically distended without focal mural thickening. Gallbladder wall is 2.2 mm in single wall thickness which is within normal limits. There is layering biliary sludge with a mobile 1.9 cm gallstone noted near the neck. No sonographic Murphy sign noted by sonographer. Common bile duct: Diameter: Ranged in size from 6.2 mm approximately the 10.4 mm distally. No choledocholithiasis. Liver: No focal lesion identified. Within normal limits in parenchymal echogenicity. Portal vein is patent on color Doppler imaging with normal direction of blood flow towards the liver. IMPRESSION: Uncomplicated cholelithiasis with biliary sludge. Electronically Signed   By: Tollie Eth M.D.   On:  11/16/2017 19:25    EKG: Orders placed or  performed during the hospital encounter of 11/16/17  . EKG 12-Lead  . EKG 12-Lead  . ED EKG 12-Lead  . ED EKG 12-Lead    IMPRESSION AND PLAN:  *Sepsis Fever of unknown origin Altered mental status likely metabolic encephalopathy  IV fluid and broad-spectrum antibiotics for now. Cultures are sent. Add urine drug screen to previous urine sample. Send influenza swab, C. difficile and GI panel, respiratory virus panel. If all of them stays negative, we may need to consider meningitis and lumbar puncture.  *Dementia Continue baseline home medicines. Now patient's daughter is dead, we may need to arrange for her placement with caretaker.  *Acute on chronic renal insufficiency IV fluids.  *Elevated LFT Will follow-up tomorrow, check ultrasound right upper quadrant.   All the records are reviewed and case discussed with ED provider. Management plans discussed with the patient, family and they are in agreement.  CODE STATUS: DNR  Discussed with patient's brother and sister-in-law in the room.  TOTAL TIME TAKING CARE OF THIS PATIENT: 50 minutes.    Altamese DillingVaibhavkumar Danecia Underdown M.D on 11/16/2017   Between 7am to 6pm - Pager - [phone]  After 6pm go to [email or web address] - Social research officer, governmentpassword [email or web address] [email or web address]  [facility] [place] [facility]  Office  [phone]  CC: Primary care physician; [facility]   Note: This dictation was prepared with Dragon dictation along with smaller phrase technology. Any transcriptional errors that result from this process are unintentional.

## 2017-11-16 NOTE — Progress Notes (Signed)
CODE SEPSIS - PHARMACY COMMUNICATION  **Broad Spectrum Antibiotics should be administered within 1 hour of Sepsis diagnosis**  Time Code Sepsis Called/Page Received: 1700  Antibiotics Ordered: vancomycin and Zosyn  Time of 1st antibiotic administration: 1718  Additional action taken by pharmacy: none required  If necessary, Name of Provider/Nurse Contacted: N/A    Lowella Bandyodney D Ustin Cruickshank ,PharmD Clinical Pharmacist  11/16/2017  5:19 PM

## 2017-11-16 NOTE — ED Triage Notes (Signed)
Pt arrives via ems from home. EMS called out for pt niece DOA. On arrival to scene ems thought pt wasn't doing well. Ems states pt was found with altered mental status, laying in bed with sheet soiled with urine. On arrival pt altered, unable to state name or give verbal responses on arrival. Wheezes can be heard without stethoscope but are diminished with stethoscope.

## 2017-11-16 NOTE — ED Provider Notes (Signed)
[facility] Emergency Department Provider Note  Time seen: 5:53 PM  I have reviewed the triage vital signs and the nursing notes.   HISTORY  Chief Complaint Altered Mental Status and Urinary Tract Infection    HPI Sheena Mitchell is a 77 y.o. female with a past medical history of dementia, hypertension, presents to the emergency department for decreased responsiveness.  According to the patient's PCP who is here with the patient she saw the patient yesterday for dehydration gave her fluids.  States her baseline she has dementia and does have confusion at times but is normally able to converse.  Today the patient is extremely somnolent, will occasionally open her eyes but then falls back asleep, was able to tell me her name, but not able to provide any history.  According to report the patient lives with a family member who is a caretaker (daughter or niece) no one had heard from them today, home health nurse went to the house but was not able to get inside as no one open the door.  EMS was called they eventually entered the patient's residents and found the patient face down in a bed with the patient's caretaker (daughter or niece/conflicting story) who was dead on arrival.  They noted that the patient appeared to be confused which per home health was worse than normal and very somnolent so they transported her to the emergency department.  The family member who is dead was taken to the morgue.  At this time we have no further history, is not clear as to what happened to the family member.  Upon arrival patient noted to be febrile to 103.3 very somnolent and confused appearing.   Past Medical History:  Diagnosis Date  . Dementia   . Hypertension     There are no active problems to display for this patient.   History reviewed. No pertinent surgical history.  Prior to Admission medications   Medication Sig Start Date End Date Taking? Authorizing Provider  aspirin EC  81 MG tablet Take 81 mg by mouth daily.   Yes [provider]  atorvastatin (LIPITOR) 10 MG tablet Take 10 mg by mouth at bedtime.   Yes [provider]  Cholecalciferol (D3-1000) 1000 units tablet Take 1,000 Units by mouth daily.   Yes [provider]  cyanocobalamin 1000 MCG tablet Take 1,000 mcg by mouth daily.   Yes [provider]  galantamine (RAZADYNE) 12 MG tablet Take 12 mg by mouth 2 (two) times daily.   Yes [provider]  memantine (NAMENDA) 10 MG tablet Take 10 mg by mouth 2 (two) times daily.   Yes [provider]  Multiple Vitamins-Minerals (CERTAVITE SENIOR/ANTIOXIDANT) TABS Take 1 tablet by mouth daily.   Yes [provider]  QUEtiapine (SEROQUEL) 25 MG tablet Take 25 mg by mouth daily with supper.   Yes [provider]    No Known Allergies  Family History  Problem Relation Age of Onset  . Colon cancer Mother     Social History Social History   Tobacco Use  . Smoking status: Former Smoker  Substance Use Topics  . Alcohol use: No  . Drug use: Not Currently    Review of Systems Unable to complete a review of systems due to confusion/dementia and altered mental status ____________________________________________   PHYSICAL EXAM:  VITAL SIGNS: ED Triage Vitals  Enc Vitals Group     BP 11/16/17 1638 (!) 164/96     Pulse --  Resp 11/16/17 1638 (!) 24     Temp 11/16/17 1714 (!) 103.3 F (39.6 C)     Temp Source 11/16/17 1714 Rectal     SpO2 11/16/17 1638 91 %     Weight 11/16/17 1639 250 lb (113.4 kg)     Height 11/16/17 1639 5\' 5"  (1.651 m)     Head Circumference --      Peak Flow --      Pain Score --      Pain Loc --      Pain Edu? --      Excl. in GC? --    Constitutional: Patient is very somnolent, will occasionally open her eyes to voice, was able to tell me her first name. Eyes: Normal exam ENT   Head: Normocephalic and atraumatic.   Mouth/Throat: Extremely dry  appearing mucous membranes Cardiovascular: Normal rate, regular rhythm. Respiratory: Normal respiratory effort without tachypnea nor retractions. Breath sounds are clear and equal bilaterally. No wheezes/rales/rhonchi. Gastrointestinal: Soft and nontender. No distention.  No reaction to abdominal palpation Musculoskeletal: No apparent tenderness in extremities. Neurologic: Very somnolent, unable to participate neurological examination. Skin:  Skin is warm, dry and intact.  Psychiatric: Very somnolent  ____________________________________________    EKG  EKG reviewed and interpreted by myself shows normal sinus rhythm 87 bpm with a narrow QRS, normal axis, normal intervals, nonspecific but no concerning ST changes.  ____________________________________________    RADIOLOGY  Chest x-ray shows mild bronchitic changes no focal consolidation.  ____________________________________________   INITIAL IMPRESSION / ASSESSMENT AND PLAN / ED COURSE  Pertinent labs & imaging results that were available during my care of the patient were reviewed by me and considered in my medical decision making (see chart for details).  Patient presents to the emergency department for altered mental status.  Very strange story per EMS and patient's primary care physician who is here with the patient, as the patient was found in a bed with a deceased relative who was the patient's primary caregiver.  Patient is febrile tachypneic meeting sepsis criteria.  Sepsis protocols have been initiated. Patient's labs are resulted showing a normal urinalysis, chest x-ray is negative, white blood cell count is normal.  Patient does have elevated LFTs.  As we have not yet found a source for the patient's fever will proceed with an ultrasound to rule out gallbladder disease.  Patient's ultrasound is largely negative besides cholelithiasis.  It is not clear the patient's cause of fever at this point.  However as the patient is  somnolent febrile meeting sepsis criteria she will be admitted to the hospital for continued IV antibiotics, and further work-up.   CRITICAL CARE Performed by: Minna AntisKevin Gabrial Poppell   Total critical care time: 30 minutes  Critical care time was exclusive of separately billable procedures and treating other patients.  Critical care was necessary to treat or prevent imminent or life-threatening deterioration.  Critical care was time spent personally by me on the following activities: development of treatment plan with patient and/or surrogate as well as nursing, discussions with consultants, evaluation of patient's response to treatment, examination of patient, obtaining history from patient or surrogate, ordering and performing treatments and interventions, ordering and review of laboratory studies, ordering and review of radiographic studies, pulse oximetry and re-evaluation of patient's condition.   ____________________________________________   FINAL CLINICAL IMPRESSION(S) / ED DIAGNOSES  Sepsis Altered mental status    Minna AntisPaduchowski, Olivette Beckmann, MD 11/16/17 1929

## 2017-11-16 NOTE — ED Notes (Addendum)
Pt brother contact info: Karren [phone]-  [phone]

## 2017-11-16 NOTE — ED Notes (Signed)
Pt brother contacted to inform him of pt bed assignment

## 2017-11-16 NOTE — Progress Notes (Signed)
Family Meeting Note  Advance Directive:yes  Today a meeting took place with the brother.  Patient is unable to participate due UJ:[id number]:Lacked capacity dementia   The following clinical team members were present during this meeting:MD  The following were discussed:Patient's diagnosis: dementia, Sepsis, fever, Patient's progosis: Unable to determine and Goals for treatment: DNR  Additional follow-up to be provided: PMD  Time spent during discussion:20 minutes  [name], MD

## 2017-11-17 ENCOUNTER — Other Ambulatory Visit: Payer: Self-pay

## 2017-11-17 ENCOUNTER — Inpatient Hospital Stay: Payer: Medicare (Managed Care)

## 2017-11-17 DIAGNOSIS — J9601 Acute respiratory failure with hypoxia: Secondary | ICD-10-CM

## 2017-11-17 LAB — BLOOD GAS, ARTERIAL
ACID-BASE DEFICIT: 4.1 mmol/L — AB (ref 0.0–2.0)
Bicarbonate: 20.9 mmol/L (ref 20.0–28.0)
FIO2: 1
O2 SAT: 99.9 %
PATIENT TEMPERATURE: 37
pCO2 arterial: 37 mmHg (ref 32.0–48.0)
pH, Arterial: 7.36 (ref 7.350–7.450)
pO2, Arterial: 264 mmHg — ABNORMAL HIGH (ref 83.0–108.0)

## 2017-11-17 LAB — RESPIRATORY PANEL BY PCR
Adenovirus: NOT DETECTED
BORDETELLA PERTUSSIS-RVPCR: NOT DETECTED
CHLAMYDOPHILA PNEUMONIAE-RVPPCR: NOT DETECTED
CORONAVIRUS HKU1-RVPPCR: NOT DETECTED
Coronavirus 229E: NOT DETECTED
Coronavirus NL63: NOT DETECTED
Coronavirus OC43: NOT DETECTED
INFLUENZA B-RVPPCR: NOT DETECTED
Influenza A: NOT DETECTED
MYCOPLASMA PNEUMONIAE-RVPPCR: NOT DETECTED
Metapneumovirus: NOT DETECTED
PARAINFLUENZA VIRUS 4-RVPPCR: NOT DETECTED
Parainfluenza Virus 1: NOT DETECTED
Parainfluenza Virus 2: NOT DETECTED
Parainfluenza Virus 3: NOT DETECTED
Respiratory Syncytial Virus: NOT DETECTED
Rhinovirus / Enterovirus: NOT DETECTED

## 2017-11-17 LAB — BASIC METABOLIC PANEL
ANION GAP: 9 (ref 5–15)
BUN: 29 mg/dL — ABNORMAL HIGH (ref 8–23)
CALCIUM: 8.7 mg/dL — AB (ref 8.9–10.3)
CO2: 22 mmol/L (ref 22–32)
Chloride: 112 mmol/L — ABNORMAL HIGH (ref 98–111)
Creatinine, Ser: 1.45 mg/dL — ABNORMAL HIGH (ref 0.44–1.00)
GFR calc Af Amer: 40 mL/min — ABNORMAL LOW (ref >60)
GFR, EST NON AFRICAN AMERICAN: 35 mL/min — AB (ref >60)
Glucose, Bld: 92 mg/dL (ref 70–99)
POTASSIUM: 4.2 mmol/L (ref 3.5–5.1)
SODIUM: 143 mmol/L (ref 135–145)

## 2017-11-17 LAB — URINE CULTURE: Culture: NO GROWTH

## 2017-11-17 LAB — HEPATIC FUNCTION PANEL
ALT: 240 U/L — ABNORMAL HIGH (ref 0–44)
AST: 93 U/L — ABNORMAL HIGH (ref 15–41)
Albumin: 2.8 g/dL — ABNORMAL LOW (ref 3.5–5.0)
Alkaline Phosphatase: 135 U/L — ABNORMAL HIGH (ref 38–126)
BILIRUBIN DIRECT: 1.2 mg/dL — AB (ref 0.0–0.2)
BILIRUBIN INDIRECT: 1.1 mg/dL — AB (ref 0.3–0.9)
TOTAL PROTEIN: 7 g/dL (ref 6.5–8.1)
Total Bilirubin: 2.3 mg/dL — ABNORMAL HIGH (ref 0.3–1.2)

## 2017-11-17 LAB — MRSA PCR SCREENING: MRSA by PCR: NEGATIVE

## 2017-11-17 LAB — CBC
HEMATOCRIT: 38.9 % (ref 35.0–47.0)
HEMOGLOBIN: 12.8 g/dL (ref 12.0–16.0)
MCH: 30.1 pg (ref 26.0–34.0)
MCHC: 32.9 g/dL (ref 32.0–36.0)
MCV: 91.6 fL (ref 80.0–100.0)
PLATELETS: 120 10*3/uL — AB (ref 150–440)
RBC: 4.24 MIL/uL (ref 3.80–5.20)
RDW: 15.5 % — AB (ref 11.5–14.5)
WBC: 8.7 10*3/uL (ref 3.6–11.0)

## 2017-11-17 LAB — GLUCOSE, CAPILLARY: GLUCOSE-CAPILLARY: 123 mg/dL — AB (ref 70–99)

## 2017-11-17 MED ORDER — LORAZEPAM 2 MG/ML IJ SOLN
INTRAMUSCULAR | Status: AC
  Administered 2017-11-17: 2 mg
  Filled 2017-11-17: qty 1

## 2017-11-17 MED ORDER — METHYLPREDNISOLONE SODIUM SUCC 125 MG IJ SOLR
125.0000 mg | Freq: Once | INTRAMUSCULAR | Status: AC
  Administered 2017-11-17: 125 mg via INTRAVENOUS

## 2017-11-17 MED ORDER — HYDRALAZINE HCL 20 MG/ML IJ SOLN
10.0000 mg | INTRAMUSCULAR | Status: DC | PRN
  Administered 2017-11-18 – 2017-11-24 (×5): 20 mg via INTRAVENOUS
  Filled 2017-11-17 (×5): qty 1

## 2017-11-17 MED ORDER — IPRATROPIUM-ALBUTEROL 0.5-2.5 (3) MG/3ML IN SOLN
3.0000 mL | RESPIRATORY_TRACT | Status: DC | PRN
  Administered 2017-11-17: 3 mL via RESPIRATORY_TRACT

## 2017-11-17 MED ORDER — PIPERACILLIN-TAZOBACTAM 3.375 G IVPB
3.3750 g | Freq: Three times a day (TID) | INTRAVENOUS | Status: DC
  Administered 2017-11-17 – 2017-11-18 (×3): 3.375 g via INTRAVENOUS
  Filled 2017-11-17 (×3): qty 50

## 2017-11-17 MED ORDER — SODIUM CHLORIDE 0.9 % IV SOLN
1250.0000 mg | INTRAVENOUS | Status: DC
  Administered 2017-11-17: 1250 mg via INTRAVENOUS
  Filled 2017-11-17: qty 1250

## 2017-11-17 MED ORDER — ENSURE ENLIVE PO LIQD
237.0000 mL | Freq: Two times a day (BID) | ORAL | Status: DC
  Administered 2017-11-17 – 2017-11-18 (×2): 237 mL via ORAL

## 2017-11-17 MED ORDER — METHYLPREDNISOLONE SODIUM SUCC 125 MG IJ SOLR
INTRAMUSCULAR | Status: AC
  Administered 2017-11-17: 125 mg via INTRAVENOUS
  Filled 2017-11-17: qty 2

## 2017-11-17 MED ORDER — ACETAMINOPHEN 650 MG RE SUPP
650.0000 mg | Freq: Four times a day (QID) | RECTAL | Status: DC | PRN
  Administered 2017-11-17: 650 mg via RECTAL
  Filled 2017-11-17: qty 1

## 2017-11-17 MED ORDER — VANCOMYCIN HCL IN DEXTROSE 1-5 GM/200ML-% IV SOLN
1000.0000 mg | INTRAVENOUS | Status: DC
Start: 2017-11-17 — End: 2017-11-17
  Filled 2017-11-17: qty 200

## 2017-11-17 MED ORDER — LEVETIRACETAM IN NACL 1000 MG/100ML IV SOLN
1000.0000 mg | Freq: Two times a day (BID) | INTRAVENOUS | Status: DC
  Administered 2017-11-17 – 2017-11-20 (×6): 1000 mg via INTRAVENOUS
  Filled 2017-11-17 (×8): qty 100

## 2017-11-17 MED ORDER — NYSTATIN 100000 UNIT/ML MT SUSP
5.0000 mL | Freq: Four times a day (QID) | OROMUCOSAL | Status: DC
  Administered 2017-11-17 – 2017-11-26 (×17): 500000 [IU] via OROMUCOSAL
  Filled 2017-11-17 (×23): qty 5

## 2017-11-17 NOTE — Progress Notes (Signed)
[employer] - [place] at [facility]                                                                                                                                                                                  Patient Demographics   Sheena Mitchell, is a 77 y.o. female, DOB - December 04, 1940, ZOX:[id number]  Admit date - 11/16/2017   Admitting Physician Altamese Dilling, MD  Outpatient Primary MD for the patient is Center, [facility]   LOS - 1  Subjective: Patient admitted with sepsis.  And altered mental status.  She is doing much better.  Her diarrhea stopped.  Patient is more awake and alert. She has dementia unable to provide any review of systems multiple family members at the bedside   Review of Systems:   CONSTITUTIONAL: Unable to provide due to dementia  Vitals:   Vitals:   11/17/17 0823 11/17/17 1123 11/17/17 1345 11/17/17 1415  BP:   (!) 157/89   Pulse:   87   Resp:   18   Temp:   99.4 F (37.4 C)   TempSrc:   Oral   SpO2: 100% 100% 100% 98%  Weight:      Height:        Wt Readings from Last 3 Encounters:  11/16/17 113.4 kg (250 lb)  10/09/15 99.8 kg (220 lb)  10/02/15 99.8 kg (220 lb)     Intake/Output Summary (Last 24 hours) at 11/17/2017 1428 Last data filed at 11/17/2017 1320 Gross per 24 hour  Intake 2798.75 ml  Output 100 ml  Net 2698.75 ml    Physical Exam:   GENERAL: Pleasant-appearing in no apparent distress.  HEAD, EYES, EARS, NOSE AND THROAT: Atraumatic, normocephalic. Extraocular muscles are intact. Pupils equal and reactive to light. Sclerae anicteric. No conjunctival injection. No oro-pharyngeal erythema.  NECK: Supple. There is no jugular venous distention. No bruits, no lymphadenopathy, no thyromegaly.  HEART: Regular rate and rhythm,. No murmurs, no rubs, no clicks.  LUNGS: Clear to auscultation bilaterally. No rales or rhonchi. No wheezes.  ABDOMEN: Soft, flat, nontender, nondistended. Has good bowel  sounds. No hepatosplenomegaly appreciated.  EXTREMITIES: No evidence of any cyanosis, clubbing, or peripheral edema.  +2 pedal and radial pulses bilaterally.  NEUROLOGIC: The patient is alert, awake, able to follow commands.  No focal deficits SKIN: Moist and warm with no rashes appreciated.  Psych: Not anxious, depressed LN: No inguinal LN enlargement    Antibiotics   Anti-infectives (From admission, onward)   Start     Dose/Rate Route Frequency Ordered Stop   11/17/17 1100  piperacillin-tazobactam (ZOSYN) IVPB 3.375 g     3.375 g 12.5 mL/hr over  240 Minutes Intravenous Every 8 hours 11/17/17 0832     11/17/17 1000  vancomycin (VANCOCIN) 1,250 mg in sodium chloride 0.9 % 250 mL IVPB     1,250 mg 166.7 mL/hr over 90 Minutes Intravenous Every 24 hours 11/17/17 0832     11/16/17 1715  piperacillin-tazobactam (ZOSYN) IVPB 3.375 g     3.375 g 100 mL/hr over 30 Minutes Intravenous  Once 11/16/17 1701 11/16/17 1750   11/16/17 1715  vancomycin (VANCOCIN) IVPB 1000 mg/200 mL premix     1,000 mg 200 mL/hr over 60 Minutes Intravenous  Once 11/16/17 1701 11/16/17 1900      Medications   Scheduled Meds: . aspirin EC  81 mg Oral Daily  . atorvastatin  10 mg Oral QHS  . feeding supplement (ENSURE ENLIVE)  237 mL Oral BID BM  . heparin  5,000 Units Subcutaneous Q8H  . ipratropium-albuterol  3 mL Nebulization Q4H  . memantine  10 mg Oral BID  . multivitamin with minerals   Oral Daily  . nystatin  5 mL Mouth/Throat QID  . QUEtiapine  25 mg Oral Q supper   Continuous Infusions: . sodium chloride 75 mL/hr at 11/17/17 1009  . piperacillin-tazobactam (ZOSYN)  IV Stopped (11/17/17 1235)  . vancomycin Stopped (11/17/17 1117)   PRN Meds:.acetaminophen   Data Review:   Micro Results Recent Results (from the past 240 hour(s))  Culture, blood (Routine x 2)     Status: None (Preliminary result)   Collection Time: 11/16/17  4:57 PM  Result Value Ref Range Status   Specimen Description BLOOD  LEFT FOREARM  Final   Special Requests   Final    BOTTLES DRAWN AEROBIC AND ANAEROBIC Blood Culture results may not be optimal due to an inadequate volume of blood received in culture bottles   Culture   Final    NO GROWTH < 24 HOURS Performed at [facility], [address], [place], [place] [address]    Report Status PENDING  Incomplete  Culture, blood (Routine x 2)     Status: None (Preliminary result)   Collection Time: 11/16/17  5:02 PM  Result Value Ref Range Status   Specimen Description BLOOD RIGHT FOREARM  Final   Special Requests   Final    BOTTLES DRAWN AEROBIC AND ANAEROBIC Blood Culture results may not be optimal due to an inadequate volume of blood received in culture bottles   Culture   Final    NO GROWTH < 24 HOURS Performed at [facility], [address], [place], [place] [address]    Report Status PENDING  Incomplete    Radiology Reports Ct Head Wo Contrast  Result Date: 11/16/2017 CLINICAL DATA:  Dementia. Altered mental status and progressive weakness. EXAM: CT HEAD WITHOUT CONTRAST TECHNIQUE: Contiguous axial images were obtained from the base of the skull through the vertex without intravenous contrast. COMPARISON:  08/05/2017 FINDINGS: Brain: Mild age related involutional changes of the brain with chronic minimal small vessel ischemia of periventricular and subcortical white matter. No acute intracranial hemorrhage, mass or midline shift. No large vascular territory infarct. No extra-axial fluid collections. Midline fourth ventricle and basal cisterns without effacement. The brainstem and cerebellum are stable. Calcifications along the tentorium are stable. Vascular: No hyperdense vessel sign. Skull: Intact Sinuses/Orbits: Nonacute Other: None IMPRESSION: Minimal chronic small vessel ischemic disease. No acute intracranial abnormality. Electronically Signed   By: Tollie Eth M.D.   On: 11/16/2017 21:12   Dg Chest [facility] 1 View  Result  Date:  11/16/2017 CLINICAL DATA:  77 y/o F; altered mental status. Abnormal breath sounds. Sepsis. EXAM: PORTABLE CHEST 1 VIEW COMPARISON:  None. FINDINGS: Normal cardiac silhouette. Aortic atherosclerosis with calcification. Mild bronchitic changes. No focal consolidation no pleural effusion or pneumothorax. No acute osseous abnormality is evident. IMPRESSION: Mild bronchitic changes. No focal consolidation. Aortic atherosclerosis. Electronically Signed   By: Mitzi Hansen M.D.   On: 11/16/2017 17:27   US Abdomen Limited Ruq  Result Date: 11/16/2017 CLINICAL DATA:  Elevated liver function test with sepsis. EXAM: ULTRASOUND ABDOMEN LIMITED RIGHT UPPER QUADRANT COMPARISON:  None. FINDINGS: Gallbladder: The gallbladder is physiologically distended without focal mural thickening. Gallbladder wall is 2.2 mm in single wall thickness which is within normal limits. There is layering biliary sludge with a mobile 1.9 cm gallstone noted near the neck. No sonographic Murphy sign noted by sonographer. Common bile duct: Diameter: Ranged in size from 6.2 mm approximately the 10.4 mm distally. No choledocholithiasis. Liver: No focal lesion identified. Within normal limits in parenchymal echogenicity. Portal vein is patent on color Doppler imaging with normal direction of blood flow towards the liver. IMPRESSION: Uncomplicated cholelithiasis with biliary sludge. Electronically Signed   By: Tollie Eth M.D.   On: 11/16/2017 19:25     CBC Recent Labs  Lab 11/16/17 1657 11/17/17 0506  WBC 5.2 8.7  HGB 12.4 12.8  HCT 37.1 38.9  PLT 131* 120*  MCV 89.3 91.6  MCH 30.0 30.1  MCHC 33.6 32.9  RDW 15.2* 15.5*  LYMPHSABS 0.5*  --   MONOABS 0.4  --   EOSABS 0.0  --   BASOSABS 0.0  --     Chemistries  Recent Labs  Lab 11/16/17 1657 11/17/17 0506  NA 140 143  K 3.9 4.2  CL 108 112*  CO2 23 22  GLUCOSE 91 92  BUN 32* 29*  CREATININE 1.56* 1.45*  CALCIUM 8.9 8.7*  AST 142* 93*  ALT 316* 240*  ALKPHOS 158*  135*  BILITOT 2.9* 2.3*   ------------------------------------------------------------------------------------------------------------------ estimated creatinine clearance is 42.8 mL/min (A) (by C-G formula based on SCr of 1.45 mg/dL (H)). ------------------------------------------------------------------------------------------------------------------ No results for input(s): HGBA1C in the last 72 hours. ------------------------------------------------------------------------------------------------------------------ No results for input(s): CHOL, HDL, LDLCALC, TRIG, CHOLHDL, LDLDIRECT in the last 72 hours. ------------------------------------------------------------------------------------------------------------------ No results for input(s): TSH, T4TOTAL, T3FREE, THYROIDAB in the last 72 hours.  Invalid input(s): FREET3 ------------------------------------------------------------------------------------------------------------------ No results for input(s): VITAMINB12, FOLATE, FERRITIN, TIBC, IRON, RETICCTPCT in the last 72 hours.  Coagulation profile Recent Labs  Lab 11/16/17 2246  INR 1.14    No results for input(s): DDIMER in the last 72 hours.  Cardiac Enzymes Recent Labs  Lab 11/16/17 2246  TROPONINI <0.03   ------------------------------------------------------------------------------------------------------------------ Invalid input(s): POCBNP    Assessment & Plan   *Sepsis Fever of unknown origin likely related to her GI syndrome Continue IV antibiotics for now follow cultures  *Altered mental status likely metabolic encephalopathy now improved continue supportive care continue IV fluids   *Dementia Continue baseline home medicines. Now patient's daughter is dead, we may need to arrange for her placement with caretaker.  *Acute on chronic renal insufficiency Improved with IV fluid  *Elevated LFT Check a hepatitis panel Trending down with  fluids   *Essential hypertension not on any medications continue monitor  *Hyperlipidemia discontinue in light of elevated liver function test     Code Status Orders  (From admission, onward)        Start     Ordered   11/16/17  2220  Do not attempt resuscitation (DNR)  Continuous    Question Answer Comment  In the event of cardiac or respiratory ARREST Do not call a "code blue"   In the event of cardiac or respiratory ARREST Do not perform Intubation, CPR, defibrillation or ACLS   In the event of cardiac or respiratory ARREST Use medication by any route, position, wound care, and other measures to relive pain and suffering. May use oxygen, suction and manual treatment of airway obstruction as needed for comfort.      11/16/17 2219    Code Status History    This patient has a current code status but no historical code status.           Consults none  DVT Prophylaxis  Lovenox   Lab Results  Component Value Date   PLT 120 (L) 11/17/2017     Time Spent in minutes   35 minutes greater than 50% of time spent in care coordination and counseling patient regarding the condition and plan of care.   Auburn BilberryShreyang Posey Jasmin M.D on 11/17/2017 at 2:28 PM  Between 7am to 6pm - Pager - [phone]  After 6pm go to [email or web address] - Social research officer, governmentpassword [email or web address] [email or web address]  [employer]   Office  [phone]

## 2017-11-17 NOTE — Progress Notes (Signed)
[employer] - [place] at [facility]   PATIENT NAME: Sheena Mitchell    MR#:  [id number]  DATE OF BIRTH:  1940-10-09  SUBJECTIVE:    a rapid response was called on the patient as she was found to be unresponsive and shaking and suspected to have a seizure.  Patient had a fever of 102.9.  Upon arrival to the patient's room she was on a nonrebreather mask, having some respiratory distress and obtunded.  REVIEW OF SYSTEMS:    Review of Systems  Unable to perform ROS: Mental acuity    DRUG ALLERGIES:  No Known Allergies  VITALS:  Blood pressure 127/89, pulse (!) 118, temperature (!) 102.9 F (39.4 C), temperature source Axillary, resp. rate (!) 22, height 5\' 5"  (1.651 m), weight 113.4 kg (250 lb), SpO2 95 %.  PHYSICAL EXAMINATION:   Physical Exam  GENERAL:  77 y.o.-year-old patient lying in bed Encephalopathic/obtunded EYES: Pupils equal, round, reactive to light  No scleral icterus.  HEENT: Head atraumatic, normocephalic. Oropharynx and nasopharynx clear.  NECK:  Supple, no jugular venous distention. No thyroid enlargement, no tenderness.  LUNGS: Positive use of accessory muscles, diffuse wheezing bilaterally, no rhonchi, rales. CARDIOVASCULAR: S1, S2 normal. No murmurs, rubs, or gallops.  ABDOMEN: Soft, nontender, nondistended. Bowel sounds present. No organomegaly or mass.  EXTREMITIES: No cyanosis, clubbing or edema b/l.    NEUROLOGIC: Encephalopathic/lethargic PSYCHIATRIC: EnCephalopathic/lethargic. SKIN: No obvious rash, lesion, or ulcer.    LABORATORY PANEL:   CBC Recent Labs  Lab 11/17/17 0506  WBC 8.7  HGB 12.8  HCT 38.9  PLT 120*   ------------------------------------------------------------------------------------------------------------------  Chemistries  Recent Labs  Lab 11/17/17 0506  NA 143  K 4.2  CL 112*  CO2 22  GLUCOSE 92  BUN 29*  CREATININE 1.45*  CALCIUM 8.7*  AST 93*  ALT 240*  ALKPHOS 135*  BILITOT 2.3*    ------------------------------------------------------------------------------------------------------------------  Cardiac Enzymes Recent Labs  Lab 11/16/17 2246  TROPONINI <0.03   ------------------------------------------------------------------------------------------------------------------  RADIOLOGY:  Ct Head Wo Contrast  Result Date: 11/16/2017 CLINICAL DATA:  Dementia. Altered mental status and progressive weakness. EXAM: CT HEAD WITHOUT CONTRAST TECHNIQUE: Contiguous axial images were obtained from the base of the skull through the vertex without intravenous contrast. COMPARISON:  08/05/2017 FINDINGS: Brain: Mild age related involutional changes of the brain with chronic minimal small vessel ischemia of periventricular and subcortical white matter. No acute intracranial hemorrhage, mass or midline shift. No large vascular territory infarct. No extra-axial fluid collections. Midline fourth ventricle and basal cisterns without effacement. The brainstem and cerebellum are stable. Calcifications along the tentorium are stable. Vascular: No hyperdense vessel sign. Skull: Intact Sinuses/Orbits: Nonacute Other: None IMPRESSION: Minimal chronic small vessel ischemic disease. No acute intracranial abnormality. Electronically Signed   By: Tollie Eth M.D.   On: 11/16/2017 21:12   Dg Chest Port 1 View  Result Date: 11/16/2017 CLINICAL DATA:  77 y/o F; altered mental status. Abnormal breath sounds. Sepsis. EXAM: PORTABLE CHEST 1 VIEW COMPARISON:  None. FINDINGS: Normal cardiac silhouette. Aortic atherosclerosis with calcification. Mild bronchitic changes. No focal consolidation no pleural effusion or pneumothorax. No acute osseous abnormality is evident. IMPRESSION: Mild bronchitic changes. No focal consolidation. Aortic atherosclerosis. Electronically Signed   By: Mitzi Hansen M.D.   On: 11/16/2017 17:27   US Abdomen Limited Ruq  Result Date: 11/16/2017 CLINICAL DATA:  Elevated liver  function test with sepsis. EXAM: ULTRASOUND ABDOMEN LIMITED RIGHT UPPER QUADRANT COMPARISON:  None. FINDINGS: Gallbladder: The gallbladder is physiologically distended without  focal mural thickening. Gallbladder wall is 2.2 mm in single wall thickness which is within normal limits. There is layering biliary sludge with a mobile 1.9 cm gallstone noted near the neck. No sonographic Murphy sign noted by sonographer. Common bile duct: Diameter: Ranged in size from 6.2 mm approximately the 10.4 mm distally. No choledocholithiasis. Liver: No focal lesion identified. Within normal limits in parenchymal echogenicity. Portal vein is patent on color Doppler imaging with normal direction of blood flow towards the liver. IMPRESSION: Uncomplicated cholelithiasis with biliary sludge. Electronically Signed   By: Tollie Ethavid  Kwon M.D.   On: 11/16/2017 19:25     ASSESSMENT AND PLAN:   77 year old female with past medical history of dementia, hypertension who was admitted to the hospital due to sepsis of unknown source/altered mental status found to have a suspected seizure and respiratory distress.  1.  Altered mental status/encephalopathy-etiology unclear but suspected to be secondary to a seizure with postictal state. - ABG showing no resp. Acidosis.  - We will get stat CT head, patient has already been started on IV Keppra. -Neurology consult has been obtained.  We will continue to follow mental status.  2.  Fever of unknown origin- patient was admitted to the hospital with suspected sepsis but source is unclear.  Patient now had a seizure and is now encephalopathic/obtunded.  Possible underlying meningitis/encephalitis. - Patient may need lumbar puncture but will defer this to neurology.  Continue empiric antibiotics with vancomycin, Zosyn for now.  3. Acute Resp. Failure/Resp. Distress - possible aspiration pneumonia/pneumonitis.  - given 125 mg of IV solumedrol, Would start on nebs, Pulmicort nebs.  - CXR stat  obtained - cont. Empiric Zosyn for now.    Pt. Has been transferred to Step down level or care. Discussed with Dr. Belia HemanKasa.      All the records are reviewed and case discussed with Care Management/Social Worker. Management plans discussed with the patient, family and they are in agreement.  CODE STATUS: DNR  DVT Prophylaxis: hep. SQ  TOTAL TIME TAKING CARE OF THIS PATIENT: 30 minutes.   Houston SirenSAINANI,Keaston Pile J M.D on 11/17/2017 at 5:46 PM  Between 7am to 6pm - Pager - [phone]  After 6pm go to [email or web address] - Therapist, nutritionalpassword EPAS [email or web address]  [employer] [place] [facility]  Office  [phone]  CC: Primary care physician; [facility]

## 2017-11-17 NOTE — Evaluation (Signed)
Clinical/Bedside Swallow Evaluation Patient Details  Name: Sheena Mitchell MRN: [id number] Date of Birth: 05/09/1941  Today's Date: 11/17/2017 Time: SLP Start Time (ACUTE ONLY): 0825 SLP Stop Time (ACUTE ONLY): 0925 SLP Time Calculation (min) (ACUTE ONLY): 60 min  Past Medical History:  Past Medical History:  Diagnosis Date  . Dementia   . Hypertension    Past Surgical History: History reviewed. No pertinent surgical history. HPI:  Pt is a 77 y.o. female with a known history of Dementia and hypertension- daughter was her care taker at home.  For last few days daughter had some flulike symptoms as per patient's family members in the room.  Patient also had diarrhea and progressive weakness for last 3 to 4 days.  She was [facility] yesterday and she had a home visiting nurse. Nurse went at home today and nobody opened the door.  So the nurse called police, they entered the house and found patient's daughter dead, and patient laying face down with unresponsive. EMS was called in and they noted patient running high-grade fever and they brought to the emergency room. Initially, pt has been minimally responsive to stimuli but opens eyes; not good verbal communication. Initial work-up for infection is negative. Head CT showed no acute issues. Pt is more awake/alert today to participate in taking po trials during BSE. She verbalized 2-3 single words w/ Brother present in room. He denied pt having any swallowing problems prior to this admission.    Assessment / Plan / Recommendation Clinical Impression  Pt appears to present w/ adequate oropharyngeal phase swallow function w/ the po trials she took at the time of the evaluation(puree and thin liquids); she is edentulous and has been less responsive since admission - eyes closed easily at this eval). Pt consumed po trials of thin liquids via cup(pt helped to hold herself) and straws then purees via spoon fed to her by SLP and Brother w/ no  overt s/s of aspiration noted; no decline in respiratory status or vocal quality during 2 verbalizations. Oral phase appeared [facility] for bolus management and oral clearing of food/liquid trials given - timely A-P transfer for swallowing. Family denied any swallowing issues/problems prior to admission stating she ate "fine" at home. Educated family on food consistencies and foods easier for mastication/oral intake in general for now; educated on supportive feeding and aspiration precautions. Recommend a Dysphagia level 1(puree) currently which will help w/ oral intake; Thin liquids. Family agreed. Recommend continue general aspiration precautions and Pills given Puree CRUSHED or Liquid form for easier swallowing. ST services will f/u w/ trials to upgrade food consistency in the diet as pt tolerates next 1-2 days. Family agreed.  SLP Visit Diagnosis: Dysphagia, oral phase (R13.11)    Aspiration Risk  (reduced following general aspiration precautions)    Diet Recommendation  Dysphagia level 1 (PUREE) w/ Thin liquids; general aspiration precautions; feeding support at meals d/t declined Cognitive status; weakness.  Medication Administration: Crushed with puree(as able or in liquid form )    Other  Recommendations Recommended Consults: (Dietician f/u) Oral Care Recommendations: Oral care BID;Staff/trained caregiver to provide oral care Other Recommendations: (n/a)   Follow up Recommendations None(TBD)      Frequency and Duration min 3x week  1 week       Prognosis Prognosis for Safe Diet Advancement: Fair(-Good) Barriers to Reach Goals: Cognitive deficits;Time post onset;Severity of deficits(baseline)      Swallow Study   General Date of Onset: 11/16/17 HPI: Pt is  a 77 y.o. female with a known history of Dementia and hypertension- daughter was her care taker at home.  For last few days daughter had some flulike symptoms as per patient's family members in the room.  Patient also had diarrhea and  progressive weakness for last 3 to 4 days.  She was taken to her [facility]ACE clinic yesterday and she had a home visiting nurse. Nurse went at home today and nobody opened the door.  So the nurse called police, they entered the house and found patient's daughter dead, and patient laying face down with unresponsive. EMS was called in and they noted patient running high-grade fever and they brought to the emergency room. Initially, pt has been minimally responsive to stimuli but opens eyes; not good verbal communication. Initial work-up for infection is negative. Head CT showed no acute issues. Pt is more awake/alert today to participate in taking po trials during BSE. She verbalized 2-3 single words w/ Brother present in room. He denied pt having any swallowing problems prior to this admission.  Type of Study: Bedside Swallow Evaluation Previous Swallow Assessment: none reported Diet Prior to this Study: NPO(regular diet at home per brother) Temperature Spikes Noted: No(wbc 8.7; temp elevated at admit) Respiratory Status: Nasal cannula(2 liters) History of Recent Intubation: No Behavior/Cognition: Alert;Cooperative;Pleasant mood;Confused;Distractible;Requires cueing Oral Cavity Assessment: Dry(slight, white patchiness on tongue(lateral, posterior)) Oral Care Completed by SLP: Yes Oral Cavity - Dentition: Edentulous Vision: Functional for self-feeding(assisted w/ holding cup) Self-Feeding Abilities: Able to feed self;Needs assist;Needs set up;Total assist(held cup to drink w/ cues) Patient Positioning: Upright in bed(needed more positioning upright for oral intake) Baseline Vocal Quality: Low vocal intensity(slightly more mumbled speech w/out effort given) Volitional Cough: (Fair) Volitional Swallow: Able to elicit    Oral/Motor/Sensory Function Overall Oral Motor/Sensory Function: Within functional limits   Ice Chips Ice chips: Within functional limits Presentation: Spoon(fed; 3 trials)   Thin Liquid  Thin Liquid: Within functional limits Presentation: Cup;Self Fed;Straw(~6 ozs total)    Nectar Thick Nectar Thick Liquid: Not tested   Honey Thick Honey Thick Liquid: Not tested   Puree Puree: Within functional limits Presentation: Spoon(fed; 4 ozs )   Solid   GO   Solid: Not tested Other Comments: d/t eyes closed; overall weakness         Jerilynn Som, MS, CCC-SLP [name],[name] 11/17/2017,2:33 PM

## 2017-11-17 NOTE — Progress Notes (Signed)
Pt found shaking and posturing. Rapid response was call. After MD assessing pt. MD decided to transfer pt to ICU. Pt is confused at the moment. She has history of dementia. Medication given per MD orders see MAR.

## 2017-11-17 NOTE — Progress Notes (Signed)
I have updated family, I have explained her grave prognosis and critical condition They have all agreed that she is a DNR/DNI  Continue biPAP Obtain CT head IV keppra Neurology consultation     Sheena Mitchell David Steven Veazie, M.D.  [facility]  Medical Director [facility] Medical Director [facility] Cardio[facility]

## 2017-11-17 NOTE — Progress Notes (Signed)
CRITICAL CARE NOTE  CC  acute respiratory failure  SUBJECTIVE I was called to bedside to evaluate patient for acute resp failure Transferred to ICU for acute mental status changes Increased WOB with severe resp failure on biPAP  Critically ill, prognosis is poor  a rapid response was called on the patient as she was found to be unresponsive and shaking and suspected to have a seizure.  Patient had a fever of 102.9.  Upon arrival to the patient's room she was on a nonrebreather mask, having some respiratory distress and obtunded.   BP 127/89 (BP Location: Right Arm)   Pulse (!) 118   Temp (!) 102.9 F (39.4 C) (Axillary)   Resp (!) 22   Ht 5\' 5"  (1.651 m)   Wt 250 lb (113.4 kg)   SpO2 95%   BMI 41.60 kg/m    REVIEW OF SYSTEMS  PATIENT IS UNABLE TO PROVIDE COMPLETE REVIEW OF SYSTEM S DUE TO SEVERE CRITICAL ILLNESS AND ENCEPHALOPATHY   PHYSICAL EXAMINATION:  GENERAL:critically ill appearing, +resp distress HEAD: Normocephalic, atraumatic.  EYES: Pupils equal, round, reactive to light.  No scleral icterus.  MOUTH: Moist mucosal membrane. NECK: Supple. No thyromegaly. No nodules. No JVD.  PULMONARY: +rhonchi, +wheezing CARDIOVASCULAR: S1 and S2. Regular rate and rhythm. No murmurs, rubs, or gallops.  GASTROINTESTINAL: Soft, nontender, -distended. No masses. Positive bowel sounds. No hepatosplenomegaly.  MUSCULOSKELETAL: No swelling, clubbing, or edema.  NEUROLOGIC: obtunded, GCS<8 SKIN:intact,warm,dry  ASSESSMENT AND PLAN  Severe Hypoxic and Hypercapnic Respiratory Failure-aspiration pneumonitis -patient is DNR/DNI -NEB THERAPY    NEUROLOGY -acute seizures-IV keppra started CT head pending Neurology consulted Poor prognosis EEG pending   CARDIAC ICU monitoring  ID -continue IV abx as prescibed -follow up cultures   DVT/GI PRX ordered TRANSFUSIONS AS NEEDED MONITOR FSBS ASSESS the need for LABS as needed   Critical Care Time devoted to patient  care services described in this note is 37 minutes.   Overall, patient is critically ill, prognosis is guarded. high risk for cardiac arrest and death.      Lucie LeatherKurian David Shani Fitch, M.D.  [facility]  Medical Director [facility] Medical Director [facility]

## 2017-11-17 NOTE — Progress Notes (Signed)
Pharmacy Antibiotic Note  Sheena Mitchell is a 77 y.o. female admitted on 11/16/2017 with sepsis.  Pharmacy has been consulted for vancomycin and Zosyn dosing. Doses were begun by ED physician but not continued by admitting physician.  Plan: Vancomycin 1000mg  in ED followed by 1250mg  IV every 24 hours.  Goal trough 15-20 mcg/mL. Ke: 0.04, T1/2: 17h, Vd 56L, calculated steady-state levels are 41/16 mcg/mL. Vt ordered prior to 4th dose. SCr has improved slightly and dose may need to be adjusted prior to the level being drawn. Appropriate Zosyn dose is 3.375g IV EI q8h. Addendum: vancomycin discontinued 7/10 Height: 5\' 5"  (165.1 cm) Weight: 250 lb (113.4 kg) IBW/kg (Calculated) : 57  Temp (24hrs), Avg:102.2 F (39 C), Min:98.4 F (36.9 C), Max:103.6 F (39.8 C)  Recent Labs  Lab 11/16/17 1657 11/16/17 2246 11/17/17 0506  WBC 5.2  --  8.7  CREATININE 1.56*  --  1.45*  LATICACIDVEN 0.9 0.6  --     Estimated Creatinine Clearance: 42.8 mL/min (A) (by C-G formula based on SCr of 1.45 mg/dL (H)).    No Known Allergies  Antimicrobials this admission: Vancomycin 7/9 >> Zosyn 7/9 >>  Microbiology results: 7/9 BCx: pending 7/9 UCx: pending   Thank you for allowing pharmacy to be a part of this patient's care.  Sheena Mitchell, PharmD 11/17/2017 8:36 AM

## 2017-11-17 NOTE — Progress Notes (Signed)
Per Dr. Allena KatzPatel okay for RN to DC enteric precautions. Pt is not having loose stool throughout the night.

## 2017-11-17 NOTE — Evaluation (Signed)
Physical Therapy Evaluation Patient Details Name: Sheena Mitchell MRN: [id number] DOB: 1940/08/24 Today's Date: 11/17/2017   History of Present Illness       Sheena Mitchell  is a 77 y.o. female with a known history of dementia and hypertension- daughter was her care taker at home.  For last few days daughter had some flulike symptoms as per patient's family members in the room.  Patient also had diarrhea and progressive weakness for last 3 to 4 days.  She [facility] and she had a home visiting nurse. Nurse went at home today and nobody opened the door.  So the nurse called police, they entered the house and found patient's daughter dead, and patient laying face down with unresponsive. EMS was called in and they noted patient running high-grade fever and they brought to the emergency room. Patient's vitals are stable and she is minimally responsive to stimuli and opens eyes, but does not make good communication.  Although initial work-up for infection is negative.  ER physician started on broad-spectrum IV antibiotics and gave to hospitalist team for further management of sepsis.     Clinical Impression  Pt presents with decreased awareness, and generalized lethargy. Pt is oriented to self, and is able to follow one step commands. Pt has 2/5 strength BUE, and in BLE with GMT. However pt able to stand with 2+ mod assist, and laterally side step with 2+ mod assist and weight shift cueing. Pt's O2 levels remained above 94% with all therapeutic activity. Pt family reports pt was able to ambulate independently in home with AD prior to this incidence of care. Pt currently presents unable to transfer or ambulate independently. Given pt's high level of weakness and lack of mobility it is recommended pt be DC to SNF at this time.      Follow Up Recommendations SNF    Equipment Recommendations  Rolling walker with 5" wheels    Recommendations for Other Services        Precautions / Restrictions Precautions Precautions: Fall Restrictions Weight Bearing Restrictions: No      Mobility  Bed Mobility Overal bed mobility: Needs Assistance Bed Mobility: Supine to Sit     Supine to sit: +2 for physical assistance;Mod assist     General bed mobility comments: Pt requires posterior support for trunk flexion with supine to sit, and is intially unstable in sitting requiring 2+ mod assist  Transfers Overall transfer level: Needs assistance   Transfers: Sit to/from Stand;Lateral/Scoot Transfers Sit to Stand: +2 physical assistance;Mod assist         General transfer comment: Pt is able to initiate standing, requiring help with forward trunk lean and needing 2= mod assist to maintian upright standing posture and balance.  Ambulation/Gait Ambulation/Gait assistance: +2 physical assistance;Mod assist           General Gait Details: Pt was able to side step 3-4 steps at side of bed required for pt repositioning, required therapists physical cueing for lateral weight shifting and 2+ mod assist.   Stairs            Wheelchair Mobility    Modified Rankin (Stroke Patients Only)       Balance Overall balance assessment: Needs assistance Sitting-balance support: Bilateral upper extremity supported;Feet supported Sitting balance-Leahy Scale: Fair   Postural control: Posterior lean;Right lateral lean  Pertinent Vitals/Pain Pain Assessment: No/denies pain    Home Living Family/patient expects to be discharged to:: Private residence Living Arrangements: Other relatives Available Help at Discharge: Family([facility])           Home Equipment: Dan HumphreysWalker - standard Additional Comments: Will clarify further home information with family    Prior Function Level of Independence: Independent with assistive device(s)         Comments: Within home with four wheel walker, pt reports aid was  available 4-8hours 5 days a week for assistance with ADL's     Hand Dominance   Dominant Hand: Right    Extremity/Trunk Assessment   Upper Extremity Assessment Upper Extremity Assessment: RUE deficits/detail;LUE deficits/detail RUE Deficits / Details: Pt was able to initiate shoulder flex and abduction, and was able to move further with mod therapists assisantance. Pt was able to hold OH arm position for roughly 3 sec. Pt was able to squeeze therapists hand on command for 5+ sec with moderate strength. LUE Deficits / Details: Pt was able to initiate shoulder flex and abduction, and was able to move further with mod therapists assisantance. Pt was able to hold OH arm position for roughly 3 sec. Pt was able to squeeze therapists hand on command for 5+ sec with moderate strength.     Lower Extremity Assessment Lower Extremity Assessment: RLE deficits/detail;LLE deficits/detail RLE Deficits / Details: Pt showed active contraction of hip abductors, adductors, extensors, and flexors; as well as knee flexors and ankle plantar/dorsiflexors but did not show independent AROM.   LLE Deficits / Details: Pt showed active contraction of hip abductors, adductors, extensors, and flexors; as well as knee flexors and ankle plantar/dorsiflexors but did not show independent AROM.      Cervical / Trunk Assessment Cervical / Trunk Assessment: Other exceptions Cervical / Trunk Exceptions: Forward rounded shoulders, kyphotic posture, and forward head position. Pt showed some trunk extensor and flexor contraction in sitting, and showed some ability to maintain upright sitting posture wiith min-mod assist.   Communication   Communication: Other (comment)(Mumbled/garbled speech)  Cognition Arousal/Alertness: Lethargic   Overall Cognitive Status: Difficult to assess                                        General Comments      Exercises Other Exercises Other Exercises: BUE: AAROM shoulder  flexion, abduction; PROM bilat wrists flex/ext BLE: AAROM hip abduction, adduction; knee flexion; ankle plantar/dorsiflexion Other Exercises: Bed mobility: Supine to sit 2+ mod assist; STS at EOB 2+ mod assist; lateral side stepping at EOB (3-4 steps) 2+ mod assist   Assessment/Plan    PT Assessment Patient needs continued PT services  PT Problem List Decreased strength;Decreased balance;Decreased cognition;Decreased mobility       PT Treatment Interventions Gait training;Therapeutic activities;Therapeutic exercise;Wheelchair mobility training;Balance training;Patient/family education;Functional mobility training    PT Goals (Current goals can be found in the Care Plan section)       Frequency Min 2X/week   Barriers to discharge Decreased caregiver support Death of daughter may lead to decreased home support    Co-evaluation               AM-PAC PT "6 Clicks" Daily Activity  Outcome Measure Difficulty turning over in bed (including adjusting bedclothes, sheets and blankets)?: Unable Difficulty moving from lying on back to sitting on the side of the bed? : Unable Difficulty sitting  down on and standing up from a chair with arms (e.g., wheelchair, bedside commode, etc,.)?: Unable Help needed moving to and from a bed to chair (including a wheelchair)?: A Lot Help needed walking in hospital room?: A Lot Help needed climbing 3-5 steps with a railing? : Total 6 Click Score: 8    End of Session   Activity Tolerance: Patient limited by fatigue;Patient limited by lethargy Patient left: in bed;with bed alarm set;with family/visitor present;with call bell/phone within reach   PT Visit Diagnosis: Muscle weakness (generalized) (M62.81);Difficulty in walking, not elsewhere classified (R26.2);Unsteadiness on feet (R26.81)    Time: 1610-9604 PT Time Calculation (min) (ACUTE ONLY): 24 min   Charges:         PT G Codes:        Grayland Jack, SPT  Grayland Jack 11/17/2017, 4:05  PM

## 2017-11-17 NOTE — Progress Notes (Signed)
Pt down to CT scan with RR and 1 RN.  Pt tolerated transfer well.  Pt back in room, pt in bed, bed locked and in low position.  Will continue to monitor.

## 2017-11-17 NOTE — Clinical Social Work Note (Signed)
CSW notified by Zella Ballobin with the [facility] that they have spoken with the family and the patient's brother will be taking her home with him. Please re-consult should CSW needs arise. York SpanielMonica Mina Mitchell MSW,LCSW [phone]

## 2017-11-17 NOTE — Progress Notes (Signed)
Pt received from floor.  Pt on non rebreather, RR placed on BIPAP upon arrival.  Pt non responsive, Flexes to pain stimulation. Pupils PERRLA.  Report received from [place] Gardens[place]uan, [place].  Keppra started, foley inserted as ordered.  Will continue to monitor.

## 2017-11-17 NOTE — Progress Notes (Signed)
Chaplain met patient's brother Sheena Mitchell in the hallway making calls to relatives. He did not have answers about the patient's condition. Chaplain discussed with the doctor what information should be conveyed. Chaplain returned to [facility] and communicated that the patient was critically ill and not improving, and that family should be contacted and advised to come to the hospital. Chaplain rounded back to the doctor to confirm that the communication had been conveyed and that the patient's family and pastor were in route to [facility]. The Unit Secretary was alerted to the situation. Chaplain remained with the brother, until he expressed that he was okay for the moment.

## 2017-11-17 NOTE — Progress Notes (Signed)
Chaplain responded to Rapid Response and met the care team in the patient's room. The patient's brother Venia Minks was present, and Chaplain offered emotional support and prayer. The patient could not be stabilized and was transferred to ICU 16.  Chaplain received a Code Stroke to the ED and responded to the code, after assuring Orpah Greek that [place] would follow-up. FYI: Tragically, the patient's daughter died in the patient's bed on 07/12/2022 of a heart attack. The patient does not know. The family intended to tell her tonight.

## 2017-11-18 ENCOUNTER — Inpatient Hospital Stay: Payer: Medicare (Managed Care)

## 2017-11-18 DIAGNOSIS — R569 Unspecified convulsions: Secondary | ICD-10-CM

## 2017-11-18 DIAGNOSIS — R4182 Altered mental status, unspecified: Secondary | ICD-10-CM

## 2017-11-18 LAB — CBC WITH DIFFERENTIAL/PLATELET
BASOS PCT: 0 %
Basophils Absolute: 0 10*3/uL (ref 0–0.1)
EOS PCT: 0 %
Eosinophils Absolute: 0 10*3/uL (ref 0–0.7)
HCT: 33.5 % — ABNORMAL LOW (ref 35.0–47.0)
Hemoglobin: 11.2 g/dL — ABNORMAL LOW (ref 12.0–16.0)
Lymphocytes Relative: 4 %
Lymphs Abs: 0.3 10*3/uL — ABNORMAL LOW (ref 1.0–3.6)
MCH: 30.1 pg (ref 26.0–34.0)
MCHC: 33.6 g/dL (ref 32.0–36.0)
MCV: 89.8 fL (ref 80.0–100.0)
MONO ABS: 0.5 10*3/uL (ref 0.2–0.9)
Monocytes Relative: 6 %
NEUTROS ABS: 7.8 10*3/uL — AB (ref 1.4–6.5)
Neutrophils Relative %: 90 %
PLATELETS: 108 10*3/uL — AB (ref 150–440)
RBC: 3.73 MIL/uL — ABNORMAL LOW (ref 3.80–5.20)
RDW: 15.7 % — AB (ref 11.5–14.5)
WBC: 8.6 10*3/uL (ref 3.6–11.0)

## 2017-11-18 LAB — COMPREHENSIVE METABOLIC PANEL
ALBUMIN: 2.5 g/dL — AB (ref 3.5–5.0)
ALK PHOS: 320 U/L — AB (ref 38–126)
ALT: 246 U/L — ABNORMAL HIGH (ref 0–44)
ANION GAP: 8 (ref 5–15)
AST: 162 U/L — ABNORMAL HIGH (ref 15–41)
BILIRUBIN TOTAL: 4.4 mg/dL — AB (ref 0.3–1.2)
BUN: 21 mg/dL (ref 8–23)
CALCIUM: 8.8 mg/dL — AB (ref 8.9–10.3)
CO2: 23 mmol/L (ref 22–32)
Chloride: 108 mmol/L (ref 98–111)
Creatinine, Ser: 1.06 mg/dL — ABNORMAL HIGH (ref 0.44–1.00)
GFR calc Af Amer: 58 mL/min — ABNORMAL LOW (ref >60)
GFR, EST NON AFRICAN AMERICAN: 50 mL/min — AB (ref >60)
GLUCOSE: 317 mg/dL — AB (ref 70–99)
Potassium: 3.7 mmol/L (ref 3.5–5.1)
Sodium: 139 mmol/L (ref 135–145)
TOTAL PROTEIN: 7 g/dL (ref 6.5–8.1)

## 2017-11-18 LAB — HEMOGLOBIN A1C
HEMOGLOBIN A1C: 6 % — AB (ref 4.8–5.6)
Mean Plasma Glucose: 125.5 mg/dL

## 2017-11-18 LAB — HEPATITIS PANEL, ACUTE
HEP B S AG: NEGATIVE
Hep A IgM: NEGATIVE
Hep B C IgM: NEGATIVE

## 2017-11-18 LAB — GLUCOSE, CAPILLARY
GLUCOSE-CAPILLARY: 115 mg/dL — AB (ref 70–99)
GLUCOSE-CAPILLARY: 144 mg/dL — AB (ref 70–99)
Glucose-Capillary: 150 mg/dL — ABNORMAL HIGH (ref 70–99)
Glucose-Capillary: 150 mg/dL — ABNORMAL HIGH (ref 70–99)
Glucose-Capillary: 245 mg/dL — ABNORMAL HIGH (ref 70–99)

## 2017-11-18 MED ORDER — SODIUM CHLORIDE 0.9 % IV SOLN
3.0000 g | Freq: Four times a day (QID) | INTRAVENOUS | Status: DC
  Administered 2017-11-18 – 2017-11-19 (×3): 3 g via INTRAVENOUS
  Filled 2017-11-18 (×6): qty 3

## 2017-11-18 MED ORDER — LORAZEPAM 2 MG/ML IJ SOLN: 2.0000 mg | Freq: Once | INTRAMUSCULAR | Status: DC

## 2017-11-18 MED ORDER — INSULIN ASPART 100 UNIT/ML ~~LOC~~ SOLN
0.0000 [IU] | SUBCUTANEOUS | Status: DC
  Administered 2017-11-18: 2 [IU] via SUBCUTANEOUS
  Administered 2017-11-18: 5 [IU] via SUBCUTANEOUS
  Administered 2017-11-18 – 2017-11-19 (×3): 2 [IU] via SUBCUTANEOUS
  Filled 2017-11-18 (×5): qty 1

## 2017-11-18 NOTE — Progress Notes (Signed)
PT Cancellation Note  Patient Details Name: Sheena Mitchell MRN: [id number] DOB: 1940/10/17   Cancelled Treatment:    Reason Eval/Treat Not Completed: Medical issues which prohibited therapy(Per chart review, patient noted with transfer to CCU due to possible seizure activity, respiratory failure.  Will require new orders to resume care.  Please reconsult as appropriate.)   Macyn Remmert H. Manson PasseyBrown, PT, DPT, NCS 11/18/17, 8:05 AM [phone]

## 2017-11-18 NOTE — Progress Notes (Signed)
Inpatient Diabetes Program Recommendations  AACE/ADA: New Consensus Statement on Inpatient Glycemic Control (2015)  Target Ranges:  Prepandial:   less than 140 mg/dL      Peak postprandial:   less than 180 mg/dL (1-2 hours)      Critically ill patients:  140 - 180 mg/dL   Lab Results  Component Value Date   GLUCAP 115 (H) 11/17/2017    Review of Glycemic Control  Diabetes history: None Current orders for Inpatient glycemic control: None  Inpatient Diabetes Program Recommendations:    Lab glucose 317 mg/dl this am. Patient received 125 mg of IV Solumedrol yesterday. If in the plan of care consider ICU order set phase 1 SQ insulin, Novolog 2-6 units Q4 hours.  Thanks,  Christena DeemShannon Aquinnah Devin RN, MSN, BC-ADM, [facility] Inpatient Diabetes Coordinator Team Pager [phone] (8a-5p)

## 2017-11-18 NOTE — Progress Notes (Signed)
[facility] nurse called voice mail box full and could not leave a message. Sheena Mitchell [phone].

## 2017-11-18 NOTE — Clinical Social Work Note (Signed)
CS[facility] and she is aware that patient had a decline in status yesterday and was transferred to ICU. Zella BallRobin stated she is speaking with her team in regards to what may be recommended now that patient has had a change in status. York SpanielMonica Dontavion Noxon MSW,LcSW [phone]

## 2017-11-18 NOTE — Progress Notes (Signed)
[employer] - [place] at [facility]                                                                                                                                                                                  Patient Demographics   Sheena Mitchell, is a 77 y.o. female, DOB - November 08, 1940, RUE:[id number]  Admit date - 11/16/2017   Admitting Physician Altamese Dilling, MD  Outpatient Primary MD for the patient is [facility], [facility]   LOS - 2  Subjective: Events of yesterday noted patient currently on BiPAP  Review of Systems:   CONSTITUTIONAL: Unable to provide due to BiPAP Vitals:   Vitals:   11/18/17 0600 11/18/17 0700 11/18/17 0800 11/18/17 0900  BP: (!) 152/73 (!) 169/81 (!) 182/80 (!) 146/68  Pulse: 78 75 88 62  Resp: (!) 21 19 19 16   Temp:   98.1 F (36.7 C)   TempSrc:   Rectal   SpO2: 100% 98% 99% 100%  Weight:      Height:        Wt Readings from Last 3 Encounters:  11/16/17 113.4 kg (250 lb)  10/09/15 99.8 kg (220 lb)  10/02/15 99.8 kg (220 lb)     Intake/Output Summary (Last 24 hours) at 11/18/2017 1543 Last data filed at 11/18/2017 1006 Gross per 24 hour  Intake 1487.92 ml  Output 950 ml  Net 537.92 ml    Physical Exam:   GENERAL: Critically ill  HEAD, EYES, EARS, NOSE AND THROAT: Atraumatic, normocephalic. . Pupils equal and reactive to light. Sclerae anicteric. No conjunctival injection. No oro-pharyngeal erythema.  NECK: Supple. There is no jugular venous distention. No bruits, no lymphadenopathy, no thyromegaly.  HEART: Regular rate and rhythm,. No murmurs, no rubs, no clicks.  LUNGS: Clear to auscultation bilaterally. No rales or rhonchi. No wheezes.  ABDOMEN: Soft, flat, nontender, nondistended. Has good bowel sounds. No hepatosplenomegaly appreciated.  EXTREMITIES: No evidence of any cyanosis, clubbing, or peripheral edema.  +2 pedal and radial pulses bilaterally.  NEUROLOGIC: Poorly responsive SKIN: Moist and  warm with no rashes appreciated.  Psych: Not anxious, depressed LN: No inguinal LN enlargement    Antibiotics   Anti-infectives (From admission, onward)   Start     Dose/Rate Route Frequency Ordered Stop   11/17/17 2200  vancomycin (VANCOCIN) IVPB 1000 mg/200 mL premix  Status:  Discontinued     1,000 mg 200 mL/hr over 60 Minutes Intravenous Every 18 hours 11/17/17 2002 11/17/17 2106   11/17/17 1100  piperacillin-tazobactam (ZOSYN) IVPB 3.375 g  Status:  Discontinued     3.375 g 12.5 mL/hr over 240 Minutes Intravenous Every 8 hours 11/17/17 0832 11/18/17 1046  11/17/17 1000  vancomycin (VANCOCIN) 1,250 mg in sodium chloride 0.9 % 250 mL IVPB  Status:  Discontinued     1,250 mg 166.7 mL/hr over 90 Minutes Intravenous Every 24 hours 11/17/17 0832 11/17/17 1432   11/16/17 1715  piperacillin-tazobactam (ZOSYN) IVPB 3.375 g     3.375 g 100 mL/hr over 30 Minutes Intravenous  Once 11/16/17 1701 11/16/17 1750   11/16/17 1715  vancomycin (VANCOCIN) IVPB 1000 mg/200 mL premix     1,000 mg 200 mL/hr over 60 Minutes Intravenous  Once 11/16/17 1701 11/16/17 1900      Medications   Scheduled Meds: . aspirin EC  81 mg Oral Daily  . feeding supplement (ENSURE ENLIVE)  237 mL Oral BID BM  . heparin  5,000 Units Subcutaneous Q8H  . insulin aspart  0-15 Units Subcutaneous Q4H  . ipratropium-albuterol  3 mL Nebulization Q4H  . LORazepam  2 mg Intravenous Once  . memantine  10 mg Oral BID  . multivitamin with minerals   Oral Daily  . nystatin  5 mL Mouth/Throat QID  . QUEtiapine  25 mg Oral Q supper   Continuous Infusions: . sodium chloride 75 mL/hr at 11/18/17 0600  . levETIRAcetam 1,000 mg (11/18/17 0602)   PRN Meds:.acetaminophen, acetaminophen, hydrALAZINE, ipratropium-albuterol   Data Review:   Micro Results Recent Results (from the past 240 hour(s))  Urine culture     Status: None   Collection Time: 11/16/17  4:48 PM  Result Value Ref Range Status   Specimen Description    Final    URINE, RANDOM Performed at [facility], [address] [place], [place] [address]    Special Requests   Final    NONE Performed at [facility], [address] [place], [place] [address]    Culture   Final    NO GROWTH Performed at [facility], [address] [place], [place] [address]    Report Status 11/17/2017 FINAL  Final  Culture, blood (Routine x 2)     Status: None (Preliminary result)   Collection Time: 11/16/17  4:57 PM  Result Value Ref Range Status   Specimen Description BLOOD LEFT FOREARM  Final   Special Requests   Final    BOTTLES DRAWN AEROBIC AND ANAEROBIC Blood Culture results may not be optimal due to an inadequate volume of blood received in culture bottles   Culture   Final    NO GROWTH 2 DAYS Performed at [facility], [address] [place], [place] [address]    Report Status PENDING  Incomplete  Culture, blood (Routine x 2)     Status: None (Preliminary result)   Collection Time: 11/16/17  5:02 PM  Result Value Ref Range Status   Specimen Description BLOOD RIGHT FOREARM  Final   Special Requests   Final    BOTTLES DRAWN AEROBIC AND ANAEROBIC Blood Culture results may not be optimal due to an inadequate volume of blood received in culture bottles   Culture   Final    NO GROWTH 2 DAYS Performed at [facility], [address] [place], [place] [address]    Report Status PENDING  Incomplete  Respiratory Panel by PCR     Status: None   Collection Time: 11/17/17  8:41 AM  Result Value Ref Range Status   Adenovirus NOT DETECTED NOT DETECTED Final   Coronavirus 229E NOT DETECTED NOT DETECTED Final   Coronavirus HKU1 NOT DETECTED NOT DETECTED Final   Coronavirus  NL63 NOT DETECTED NOT DETECTED Final   Coronavirus OC43 NOT DETECTED NOT DETECTED Final   Metapneumovirus NOT DETECTED NOT DETECTED Final   Rhinovirus / Enterovirus NOT DETECTED NOT DETECTED Final   Influenza A NOT DETECTED NOT DETECTED  Final   Influenza B NOT DETECTED NOT DETECTED Final   Parainfluenza Virus 1 NOT DETECTED NOT DETECTED Final   Parainfluenza Virus 2 NOT DETECTED NOT DETECTED Final   Parainfluenza Virus 3 NOT DETECTED NOT DETECTED Final   Parainfluenza Virus 4 NOT DETECTED NOT DETECTED Final   Respiratory Syncytial Virus NOT DETECTED NOT DETECTED Final   Bordetella pertussis NOT DETECTED NOT DETECTED Final   Chlamydophila pneumoniae NOT DETECTED NOT DETECTED Final   Mycoplasma pneumoniae NOT DETECTED NOT DETECTED Final    Comment: Performed at [facility], [address] [place], [place] [address]  MRSA PCR Screening     Status: None   Collection Time: 11/17/17  6:54 PM  Result Value Ref Range Status   MRSA by PCR NEGATIVE NEGATIVE Final    Comment:        The GeneXpert MRSA Assay (FDA approved for NASAL specimens only), is one component of a comprehensive MRSA colonization surveillance program. It is not intended to diagnose MRSA infection nor to guide or monitor treatment for MRSA infections. Performed at [facility], [address], [place], [place] [address]     Radiology Reports Ct Head Wo Contrast  Result Date: 11/17/2017 CLINICAL DATA:  77 y/o F; acute mental status change. Fever and suspected seizure. EXAM: CT HEAD WITHOUT CONTRAST TECHNIQUE: Contiguous axial images were obtained from the base of the skull through the vertex without intravenous contrast. COMPARISON:  11/16/2017 CT head FINDINGS: Brain: No evidence of acute infarction, hemorrhage, hydrocephalus, extra-axial collection or mass lesion/mass effect. Stable chronic microvascular ischemic changes and parenchymal volume loss of the brain. Vascular: Calcific atherosclerosis of carotid siphons. No hyperdense vessel. Skull: Normal. Negative for fracture or focal lesion. Sinuses/Orbits: No acute finding. Other: None. IMPRESSION: 1. No acute intracranial abnormality identified. 2. Stable chronic microvascular ischemic  changes and parenchymal volume loss of the brain. Electronically Signed   By: Mitzi Hansen M.D.   On: 11/17/2017 18:41   Ct Head Wo Contrast  Result Date: 11/16/2017 CLINICAL DATA:  Dementia. Altered mental status and progressive weakness. EXAM: CT HEAD WITHOUT CONTRAST TECHNIQUE: Contiguous axial images were obtained from the base of the skull through the vertex without intravenous contrast. COMPARISON:  08/05/2017 FINDINGS: Brain: Mild age related involutional changes of the brain with chronic minimal small vessel ischemia of periventricular and subcortical white matter. No acute intracranial hemorrhage, mass or midline shift. No large vascular territory infarct. No extra-axial fluid collections. Midline fourth ventricle and basal cisterns without effacement. The brainstem and cerebellum are stable. Calcifications along the tentorium are stable. Vascular: No hyperdense vessel sign. Skull: Intact Sinuses/Orbits: Nonacute Other: None IMPRESSION: Minimal chronic small vessel ischemic disease. No acute intracranial abnormality. Electronically Signed   By: Tollie Eth M.D.   On: 11/16/2017 21:12   Mr Brain Wo Contrast  Result Date: 11/18/2017 CLINICAL DATA:  77 y/o F; found down. History of dementia and hypertension. Sepsis. EXAM: MRI HEAD WITHOUT CONTRAST TECHNIQUE: Multiplanar, multiecho pulse sequences of the brain and surrounding structures were obtained without intravenous contrast. COMPARISON:  11/17/2017 CT head.  10/09/2015 MRI head. FINDINGS: Brain: Extensive motion degradation on multiple sequences. No reduced diffusion to suggest acute or early subacute infarction. Stable nonspecific foci of T2 FLAIR hyperintense signal  abnormality in subcortical and periventricular white matter are compatible with mild chronic microvascular ischemic changes for age. Mild brain parenchymal volume loss. No focal mass effect, extra-axial collection, hydrocephalus, or herniation. Punctate stable focus of  susceptibility hypointensity in left cerebellar hemisphere compatible with chronic hemosiderin deposition from microhemorrhage. Vascular: Normal flow voids. Skull and upper cervical spine: Normal marrow signal. Sinuses/Orbits: Right maxillary sinus mucous retention cyst. No significant abnormal signal of mastoid air cells. Orbits are unremarkable. Other: None. IMPRESSION: 1. Motion degraded study. 2. No acute intracranial abnormality identified. 3. Stable mild chronic microvascular ischemic changes and parenchymal volume loss of the brain. Electronically Signed   By: Mitzi Hansen M.D.   On: 11/18/2017 14:33   Dg Chest Port 1 View  Result Date: 11/17/2017 CLINICAL DATA:  Fever, unresponsive. EXAM: PORTABLE CHEST 1 VIEW COMPARISON:  Radiograph of November 16, 2017. FINDINGS: The heart size and mediastinal contours are within normal limits. Atherosclerosis of thoracic aorta is noted. Hypoinflation of the lungs is noted with minimal bibasilar subsegmental atelectasis. The visualized skeletal structures are unremarkable. IMPRESSION: Hypoinflation of lungs with minimal bibasilar subsegmental atelectasis. Aortic Atherosclerosis (ICD10-I70.0). Electronically Signed   By: Lupita Raider, M.D.   On: 11/17/2017 18:07   Dg Chest Port 1 View  Result Date: 11/16/2017 CLINICAL DATA:  77 y/o F; altered mental status. Abnormal breath sounds. Sepsis. EXAM: PORTABLE CHEST 1 VIEW COMPARISON:  None. FINDINGS: Normal cardiac silhouette. Aortic atherosclerosis with calcification. Mild bronchitic changes. No focal consolidation no pleural effusion or pneumothorax. No acute osseous abnormality is evident. IMPRESSION: Mild bronchitic changes. No focal consolidation. Aortic atherosclerosis. Electronically Signed   By: Mitzi Hansen M.D.   On: 11/16/2017 17:27   US Abdomen Limited Ruq  Result Date: 11/16/2017 CLINICAL DATA:  Elevated liver function test with sepsis. EXAM: ULTRASOUND ABDOMEN LIMITED RIGHT UPPER  QUADRANT COMPARISON:  None. FINDINGS: Gallbladder: The gallbladder is physiologically distended without focal mural thickening. Gallbladder wall is 2.2 mm in single wall thickness which is within normal limits. There is layering biliary sludge with a mobile 1.9 cm gallstone noted near the neck. No sonographic Murphy sign noted by sonographer. Common bile duct: Diameter: Ranged in size from 6.2 mm approximately the 10.4 mm distally. No choledocholithiasis. Liver: No focal lesion identified. Within normal limits in parenchymal echogenicity. Portal vein is patent on color Doppler imaging with normal direction of blood flow towards the liver. IMPRESSION: Uncomplicated cholelithiasis with biliary sludge. Electronically Signed   By: Tollie Eth M.D.   On: 11/16/2017 19:25     CBC Recent Labs  Lab 11/16/17 1657 11/17/17 0506 11/18/17 0630  WBC 5.2 8.7 8.6  HGB 12.4 12.8 11.2*  HCT 37.1 38.9 33.5*  PLT 131* 120* 108*  MCV 89.3 91.6 89.8  MCH 30.0 30.1 30.1  MCHC 33.6 32.9 33.6  RDW 15.2* 15.5* 15.7*  LYMPHSABS 0.5*  --  0.3*  MONOABS 0.4  --  0.5  EOSABS 0.0  --  0.0  BASOSABS 0.0  --  0.0    Chemistries  Recent Labs  Lab 11/16/17 1657 11/17/17 0506 11/18/17 0630  NA 140 143 139  K 3.9 4.2 3.7  CL 108 112* 108  CO2 23 22 23   GLUCOSE 91 92 317*  BUN 32* 29* 21  CREATININE 1.56* 1.45* 1.06*  CALCIUM 8.9 8.7* 8.8*  AST 142* 93* 162*  ALT 316* 240* 246*  ALKPHOS 158* 135* 320*  BILITOT 2.9* 2.3* 4.4*   ------------------------------------------------------------------------------------------------------------------ estimated creatinine clearance is 58.5 mL/min (A) (by  C-G formula based on SCr of 1.06 mg/dL (H)). ------------------------------------------------------------------------------------------------------------------ No results for input(s): HGBA1C in the last 72  hours. ------------------------------------------------------------------------------------------------------------------ No results for input(s): CHOL, HDL, LDLCALC, TRIG, CHOLHDL, LDLDIRECT in the last 72 hours. ------------------------------------------------------------------------------------------------------------------ No results for input(s): TSH, T4TOTAL, T3FREE, THYROIDAB in the last 72 hours.  Invalid input(s): FREET3 ------------------------------------------------------------------------------------------------------------------ No results for input(s): VITAMINB12, FOLATE, FERRITIN, TIBC, IRON, RETICCTPCT in the last 72 hours.  Coagulation profile Recent Labs  Lab 11/16/17 2246  INR 1.14    No results for input(s): DDIMER in the last 72 hours.  Cardiac Enzymes Recent Labs  Lab 11/16/17 2246  TROPONINI <0.03   ------------------------------------------------------------------------------------------------------------------ Invalid input(s): POCBNP    Assessment & Plan   *Sepsis Fever of unknown origin likely related to her GI syndrome Continue IV antibiotics for now cultures negative May need lumbar puncture Consider doxycycline patient has had tick exposure   *Altered mental status likely metabolic encephalopathy  MRI without stroke  *Seizure continue IV Keppra EEG and neurology consult pending  *Dementia Continue baseline home medicines.  *Acute on chronic renal insufficiency Improved with IV fluid  *Elevated LFT Hepatitis panel negative Ultrasound shows no liver abnormality  *Essential hypertension not on any medications continue monitor  *Hyperlipidemia cholesterol medications discontinued  Prognosis very poor     Code Status Orders  (From admission, onward)        Start     Ordered   11/16/17 2220  Do not attempt resuscitation (DNR)  Continuous    Question Answer Comment  In the event of cardiac or respiratory ARREST Do not  call a "code blue"   In the event of cardiac or respiratory ARREST Do not perform Intubation, CPR, defibrillation or ACLS   In the event of cardiac or respiratory ARREST Use medication by any route, position, wound care, and other measures to relive pain and suffering. May use oxygen, suction and manual treatment of airway obstruction as needed for comfort.      11/16/17 2219    Code Status History    This patient has a current code status but no historical code status.           Consults none  DVT Prophylaxis  Lovenox   Lab Results  Component Value Date   PLT 108 (L) 11/18/2017     Time Spent in minutes   35 minutes greater than 50% of time spent in care coordination and counseling patient regarding the condition and plan of care.   Auburn Bilberry M.D on 11/18/2017 at 3:43 PM  Between 7am to 6pm - Pager - [phone]  After 6pm go to [email or web address] - Social research officer, government  [employer]   Office  [phone]

## 2017-11-18 NOTE — Procedures (Signed)
ELECTROENCEPHALOGRAM REPORT   Patient: Sheena Mitchell       Room #: IC16A-AA EEG No. ID: [id number] Age: 77 y.o.        Sex: female Referring Physician: Kasa Report Date:  11/18/2017        Interpreting Physician: Thana FarrEYNOLDS, Kellis Topete  History: Sheena Firemanhelma H [name] is an 77 y.o. female with dementia and new onset seizure  Medications:  Unasyn, ASA, Insulin, Keppra, Namenda, MVI, Seroquel  Conditions of Recording:  This is a 16 channel EEG carried out with the patient in the awake state.  Description:  The background activity is very low voltage.  Due to the low voltage it is often obscured by muscle and movement artifact and can not be evaluated.  The underlying rhythm does often appear to be slow in the delta range but theta and alpha rhythms can be appreciated at times.  There is no evidence of stage II sleep.  No epileptiform activity is noted.   Hyperventilation and intermittent photic stimulation were not performed.  IMPRESSION: This is an abnormal EEG secondary to general background slowing.  This finding may be seen with a diffuse disturbance that is etiologically nonspecific, but may include a metabolic encephalopathy, among other possibilities.  No epileptiform activity was noted.     Thana FarrLeslie Coye Dawood, MD Neurology [phone] 11/18/2017, 7:09 PM

## 2017-11-18 NOTE — Progress Notes (Signed)
CRITICAL CARE NOTE  CC  Follow up severe acute respiratory failure  SUBJECTIVE Remains obtunded Remains on biPAP CT head negative MRI pending Neuro consult pending EEG pending   BP (!) 152/73   Pulse 78   Temp 98.5 F (36.9 C) (Rectal)   Resp (!) 21   Ht 5\' 5"  (1.651 m)   Wt 250 lb (113.4 kg)   SpO2 100%   BMI 41.60 kg/m    REVIEW OF SYSTEMS  PATIENT IS UNABLE TO PROVIDE COMPLETE REVIEW OF SYSTEM S DUE TO SEVERE CRITICAL ILLNESS AND ENCEPHALOPATHY   PHYSICAL EXAMINATION: GENERAL:critically ill appearing, +resp distress HEAD: Normocephalic, atraumatic.  EYES: Pupils equal, round, reactive to light.  No scleral icterus.  MOUTH: Moist mucosal membrane. NECK: Supple. No thyromegaly. No nodules. No JVD.  PULMONARY: +rhonchi, +wheezing CARDIOVASCULAR: S1 and S2. Regular rate and rhythm. No murmurs, rubs, or gallops.  GASTROINTESTINAL: Soft, nontender, -distended. No masses. Positive bowel sounds. No hepatosplenomegaly.  MUSCULOSKELETAL: No swelling, clubbing, or edema.  NEUROLOGIC: obtunded, GCS<8 SKIN:intact,warm,dry   ASSESSMENT AND PLAN  Severe Hypoxic and Hypercapnic Respiratory Failure-aspiration pneumonitis -patient is DNR/DNI -NEB THERAPY    NEUROLOGY -acute seizures-IV keppra started Neurology consulted Poor prognosis EEG pending   CARDIAC ICU monitoring  ID -continue IV abx as prescibed -follow up cultures   DVT/GI PRX ordered TRANSFUSIONS AS NEEDED MONITOR FSBS ASSESS the need for LABS as needed   Critical Care Time devoted to patient care services described in this note is 35 minutes.   Overall, patient is critically ill, prognosis is guarded. high risk for cardiac arrest and death. Recommend palliative care consult   Lucie LeatherKurian David Riya Huxford, M.D.  [facility]  Medical Director [facility] Medical Director [facility] Cardio-Pulmonary Department

## 2017-11-18 NOTE — Progress Notes (Signed)
eeg completed ° °

## 2017-11-18 NOTE — Progress Notes (Signed)
SLP Cancellation Note  Patient Details Name: Sheena Mitchell MRN: [id number] DOB: 01-01-1941   Cancelled treatment:       Reason Eval/Treat Not Completed: Medical issues which prohibited therapy  Pt is not responsive at this time. Family present and educated to contact NSG and SLP if improvement in status occurs to allow pt to participate in treatment. Family agreed to plan. Pt not appropriate for trials at this time. SLP will continue to follow up.    Meredith PelStacie Harris Sauber 11/18/2017, 9:44 AM

## 2017-11-18 NOTE — Consult Note (Signed)
Reason for Consult:AMS Referring Physician: Allena Katz  CC: AMS  HPI: Sheena Mitchell is an 77 y.o. female with a history of dementia who is unable to provide history today due to her mental status.  Sister is present but was not present at initial evaluation.  At baseline patient was cared for by her daughter and required 24-7 care.  At times she required to be fed.  Food had to be a special consistency and at times did not know family.  On 7/9 was left by sister at about 1400.  Nurse went at home later that day and nobody opened the door.  So the nurse called police, they entered the house and found patient's daughter dead and patient laying face down, unresponsive. EMS was called in and they noted patient to be febrile and patient was brought to the ED.  Patient has previously had flu-like symptoms, diarrhea and progressive weakness.   On 7/10 patient found unresponsive and shaking, consistent with seizure.  Patient started on Keppra.     Past Medical History:  Diagnosis Date  . Dementia   . Hypertension     History reviewed. No pertinent surgical history.  Family History  Problem Relation Age of Onset  . Colon cancer Mother     Social History:  reports that she has quit smoking. She does not have any smokeless tobacco history on file. She reports that she has current or past drug history. She reports that she does not drink alcohol.  No Known Allergies  Medications:  I have reviewed the patient's current medications. Prior to Admission:  Medications Prior to Admission  Medication Sig Dispense Refill Last Dose  . aspirin EC 81 MG tablet Take 81 mg by mouth daily.   11/15/2017 at 0800  . atorvastatin (LIPITOR) 10 MG tablet Take 10 mg by mouth at bedtime.   11/15/2017 at 2000  . Cholecalciferol (D3-1000) 1000 units tablet Take 1,000 Units by mouth daily.   11/15/2017 at 0800  . cyanocobalamin 1000 MCG tablet Take 1,000 mcg by mouth daily.   11/15/2017 at 0800  . galantamine (RAZADYNE) 12 MG  tablet Take 12 mg by mouth 2 (two) times daily.   11/15/2017 at 2000  . memantine (NAMENDA) 10 MG tablet Take 10 mg by mouth 2 (two) times daily.   11/15/2017 at 2000  . Multiple Vitamins-Minerals (CERTAVITE SENIOR/ANTIOXIDANT) TABS Take 1 tablet by mouth daily.   11/15/2017 at 0800  . QUEtiapine (SEROQUEL) 25 MG tablet Take 25 mg by mouth daily with supper.   11/15/2017 at 1800   Scheduled: . aspirin EC  81 mg Oral Daily  . feeding supplement (ENSURE ENLIVE)  237 mL Oral BID BM  . heparin  5,000 Units Subcutaneous Q8H  . insulin aspart  0-15 Units Subcutaneous Q4H  . ipratropium-albuterol  3 mL Nebulization Q4H  . LORazepam  2 mg Intravenous Once  . memantine  10 mg Oral BID  . multivitamin with minerals   Oral Daily  . nystatin  5 mL Mouth/Throat QID  . QUEtiapine  25 mg Oral Q supper    ROS: Unable to provide due to mental status  Physical Examination: Blood pressure (!) 146/68, pulse 62, temperature 98.1 F (36.7 C), temperature source Rectal, resp. rate 16, height 5\' 5"  (1.651 m), weight 113.4 kg (250 lb), SpO2 100 %.  HEENT-  Normocephalic, no lesions, without obvious abnormality.  Normal external eye and conjunctiva.  Normal TM's bilaterally.  Normal auditory canals and external ears. Normal external  nose, mucus membranes and septum.  Normal pharynx. Cardiovascular- S1, S2 normal, pulses palpable throughout   Lungs- chest clear, no wheezing, rales, normal symmetric air entry Abdomen- soft, non-tender; bowel sounds normal; no masses,  no organomegaly Extremities- no edema Lymph-no adenopathy palpable Musculoskeletal-no joint tenderness, deformity or swelling Skin-erythmatous streaks on upper arms, right greater than left  Neurological Examination   Mental Status: Eyes closed.  Resisting me and mumbling at times.  Does not follow commands.  No speech. . Cranial Nerves: II: Discs flat bilaterally; Pupils equal, round, reactive to light and accommodation III,IV,VI: doll's response  absent bilaterally.  V,VII: corneal reflex present bilaterally  VIII: patient does not respond to verbal stimuli IX,X: gag reflex unable to be tested, XI: trapezius strength unable to test bilaterally XII: tongue strength unable to test Motor: Resists active movement of extremities Sensory: Grimaces to light stimuli in all extremities. Deep Tendon Reflexes:  1+ in the upper extremities and absent in the lower extremities Plantars: downgoing bilaterally Cerebellar: Unable to perform    Laboratory Studies:   Basic Metabolic Panel: Recent Labs  Lab 11/16/17 1657 11/17/17 0506 11/18/17 0630  NA 140 143 139  K 3.9 4.2 3.7  CL 108 112* 108  CO2 23 22 23   GLUCOSE 91 92 317*  BUN 32* 29* 21  CREATININE 1.56* 1.45* 1.06*  CALCIUM 8.9 8.7* 8.8*    Liver Function Tests: Recent Labs  Lab 11/16/17 1657 11/17/17 0506 11/18/17 0630  AST 142* 93* 162*  ALT 316* 240* 246*  ALKPHOS 158* 135* 320*  BILITOT 2.9* 2.3* 4.4*  PROT 7.6 7.0 7.0  ALBUMIN 3.1* 2.8* 2.5*   No results for input(s): LIPASE, AMYLASE in the last 168 hours. No results for input(s): AMMONIA in the last 168 hours.  CBC: Recent Labs  Lab 11/16/17 1657 11/17/17 0506 11/18/17 0630  WBC 5.2 8.7 8.6  NEUTROABS 4.3  --  7.8*  HGB 12.4 12.8 11.2*  HCT 37.1 38.9 33.5*  MCV 89.3 91.6 89.8  PLT 131* 120* 108*    Cardiac Enzymes: Recent Labs  Lab 11/16/17 2246  TROPONINI <0.03    BNP: Invalid input(s): POCBNP  CBG: Recent Labs  Lab 11/17/17 1710 11/17/17 1740 11/18/17 1208  GLUCAP 123* 115* 245*    Microbiology: Results for orders placed or performed during the hospital encounter of 11/16/17  Urine culture     Status: None   Collection Time: 11/16/17  4:48 PM  Result Value Ref Range Status   Specimen Description   Final    URINE, RANDOM Performed at [facility], [address], [place], [place] [address]    Special Requests   Final    NONE Performed at [facility],  [address] [place], [place] [address]    Culture   Final    NO GROWTH Performed at [facility], [address] [place], [place] [address]    Report Status 11/17/2017 FINAL  Final  Culture, blood (Routine x 2)     Status: None (Preliminary result)   Collection Time: 11/16/17  4:57 PM  Result Value Ref Range Status   Specimen Description BLOOD LEFT FOREARM  Final   Special Requests   Final    BOTTLES DRAWN AEROBIC AND ANAEROBIC Blood Culture results may not be optimal due to an inadequate volume of blood received in culture bottles   Culture   Final    NO GROWTH 2 DAYS Performed at [facility], [address]  [place], [place] [address]    Report Status PENDING  Incomplete  Culture, blood (Routine x 2)     Status: None (Preliminary result)   Collection Time: 11/16/17  5:02 PM  Result Value Ref Range Status   Specimen Description BLOOD RIGHT FOREARM  Final   Special Requests   Final    BOTTLES DRAWN AEROBIC AND ANAEROBIC Blood Culture results may not be optimal due to an inadequate volume of blood received in culture bottles   Culture   Final    NO GROWTH 2 DAYS Performed at [facility], [address] [place], [place] [address]    Report Status PENDING  Incomplete  Respiratory Panel by PCR     Status: None   Collection Time: 11/17/17  8:41 AM  Result Value Ref Range Status   Adenovirus NOT DETECTED NOT DETECTED Final   Coronavirus 229E NOT DETECTED NOT DETECTED Final   Coronavirus HKU1 NOT DETECTED NOT DETECTED Final   Coronavirus NL63 NOT DETECTED NOT DETECTED Final   Coronavirus OC43 NOT DETECTED NOT DETECTED Final   Metapneumovirus NOT DETECTED NOT DETECTED Final   Rhinovirus / Enterovirus NOT DETECTED NOT DETECTED Final   Influenza A NOT DETECTED NOT DETECTED Final   Influenza B NOT DETECTED NOT DETECTED Final   Parainfluenza Virus 1 NOT DETECTED NOT DETECTED Final   Parainfluenza Virus 2 NOT DETECTED NOT DETECTED Final   Parainfluenza  Virus 3 NOT DETECTED NOT DETECTED Final   Parainfluenza Virus 4 NOT DETECTED NOT DETECTED Final   Respiratory Syncytial Virus NOT DETECTED NOT DETECTED Final   Bordetella pertussis NOT DETECTED NOT DETECTED Final   Chlamydophila pneumoniae NOT DETECTED NOT DETECTED Final   Mycoplasma pneumoniae NOT DETECTED NOT DETECTED Final    Comment: Performed at [facility], [address] [place], [place] [address]  MRSA PCR Screening     Status: None   Collection Time: 11/17/17  6:54 PM  Result Value Ref Range Status   MRSA by PCR NEGATIVE NEGATIVE Final    Comment:        The GeneXpert MRSA Assay (FDA approved for NASAL specimens only), is one component of a comprehensive MRSA colonization surveillance program. It is not intended to diagnose MRSA infection nor to guide or monitor treatment for MRSA infections. Performed at [facility], [address] [place], [place] [address]     Coagulation Studies: Recent Labs    11/16/17 2246  LABPROT 14.5  INR 1.14    Urinalysis:  Recent Labs  Lab 11/16/17 1648  COLORURINE AMBER*  LABSPEC 1.019  PHURINE 5.0  GLUCOSEU NEGATIVE  HGBUR SMALL*  BILIRUBINUR NEGATIVE  KETONESUR 5*  PROTEINUR 30*  NITRITE NEGATIVE  LEUKOCYTESUR NEGATIVE    Lipid Panel:  No results found for: CHOL, TRIG, HDL, CHOLHDL, VLDL, LDLCALC  HgbA1C: No results found for: HGBA1C  Urine Drug Screen:      Component Value Date/Time   LABOPIA NONE DETECTED 11/16/2017 1648   COCAINSCRNUR NONE DETECTED 11/16/2017 1648   LABBENZ NONE DETECTED 11/16/2017 1648   AMPHETMU NONE DETECTED 11/16/2017 1648   THCU NONE DETECTED 11/16/2017 1648   LABBARB (A) 11/16/2017 1648    Result not available. Reagent lot number recalled by manufacturer.    Alcohol Level: No results for input(s): ETH in the last 168 hours.  Other results: EKG: sinus rhythm at 87 bpm.  Imaging: Ct Head Wo Contrast  Result Date: 11/17/2017 CLINICAL DATA:  77 y/o F; acute  mental status change. Fever  and suspected seizure. EXAM: CT HEAD WITHOUT CONTRAST TECHNIQUE: Contiguous axial images were obtained from the base of the skull through the vertex without intravenous contrast. COMPARISON:  11/16/2017 CT head FINDINGS: Brain: No evidence of acute infarction, hemorrhage, hydrocephalus, extra-axial collection or mass lesion/mass effect. Stable chronic microvascular ischemic changes and parenchymal volume loss of the brain. Vascular: Calcific atherosclerosis of carotid siphons. No hyperdense vessel. Skull: Normal. Negative for fracture or focal lesion. Sinuses/Orbits: No acute finding. Other: None. IMPRESSION: 1. No acute intracranial abnormality identified. 2. Stable chronic microvascular ischemic changes and parenchymal volume loss of the brain. Electronically Signed   By: Mitzi HansenLance  Furusawa-Stratton M.D.   On: 11/17/2017 18:41   Ct Head Wo Contrast  Result Date: 11/16/2017 CLINICAL DATA:  Dementia. Altered mental status and progressive weakness. EXAM: CT HEAD WITHOUT CONTRAST TECHNIQUE: Contiguous axial images were obtained from the base of the skull through the vertex without intravenous contrast. COMPARISON:  08/05/2017 FINDINGS: Brain: Mild age related involutional changes of the brain with chronic minimal small vessel ischemia of periventricular and subcortical white matter. No acute intracranial hemorrhage, mass or midline shift. No large vascular territory infarct. No extra-axial fluid collections. Midline fourth ventricle and basal cisterns without effacement. The brainstem and cerebellum are stable. Calcifications along the tentorium are stable. Vascular: No hyperdense vessel sign. Skull: Intact Sinuses/Orbits: Nonacute Other: None IMPRESSION: Minimal chronic small vessel ischemic disease. No acute intracranial abnormality. Electronically Signed   By: Tollie Ethavid  Kwon M.D.   On: 11/16/2017 21:12   Mr Brain Wo Contrast  Result Date: 11/18/2017 CLINICAL DATA:  [age] y/o F; found down.  History of dementia and hypertension. Sepsis. EXAM: MRI HEAD WITHOUT CONTRAST TECHNIQUE: Multiplanar, multiecho pulse sequences of the brain and surrounding structures were obtained without intravenous contrast. COMPARISON:  11/17/2017 CT head.  10/09/2015 MRI head. FINDINGS: Brain: Extensive motion degradation on multiple sequences. No reduced diffusion to suggest acute or early subacute infarction. Stable nonspecific foci of T2 FLAIR hyperintense signal abnormality in subcortical and periventricular white matter are compatible with mild chronic microvascular ischemic changes for age. Mild brain parenchymal volume loss. No focal mass effect, extra-axial collection, hydrocephalus, or herniation. Punctate stable focus of susceptibility hypointensity in left cerebellar hemisphere compatible with chronic hemosiderin deposition from microhemorrhage. Vascular: Normal flow voids. Skull and upper cervical spine: Normal marrow signal. Sinuses/Orbits: Right maxillary sinus mucous retention cyst. No significant abnormal signal of mastoid air cells. Orbits are unremarkable. Other: None. IMPRESSION: 1. Motion degraded study. 2. No acute intracranial abnormality identified. 3. Stable mild chronic microvascular ischemic changes and parenchymal volume loss of the brain. Electronically Signed   By: Mitzi HansenLance  Furusawa-Stratton M.D.   On: 11/18/2017 14:33   Dg Chest Port 1 View  Result Date: 11/17/2017 CLINICAL DATA:  Fever, unresponsive. EXAM: PORTABLE CHEST 1 VIEW COMPARISON:  Radiograph of November 16, 2017. FINDINGS: The heart size and mediastinal contours are within normal limits. Atherosclerosis of thoracic aorta is noted. Hypoinflation of the lungs is noted with minimal bibasilar subsegmental atelectasis. The visualized skeletal structures are unremarkable. IMPRESSION: Hypoinflation of lungs with minimal bibasilar subsegmental atelectasis. Aortic Atherosclerosis (ICD10-I70.0). Electronically Signed   By: Lupita RaiderJames  Green Jr, M.D.    On: 11/17/2017 18:07   Dg Chest Port 1 View  Result Date: 11/16/2017 CLINICAL DATA:  77 y/o F; altered mental status. Abnormal breath sounds. Sepsis. EXAM: PORTABLE CHEST 1 VIEW COMPARISON:  None. FINDINGS: Normal cardiac silhouette. Aortic atherosclerosis with calcification. Mild bronchitic changes. No focal consolidation no pleural effusion or pneumothorax. No acute osseous  abnormality is evident. IMPRESSION: Mild bronchitic changes. No focal consolidation. Aortic atherosclerosis. Electronically Signed   By: Mitzi Hansen M.D.   On: 11/16/2017 17:27   US Abdomen Limited Ruq  Result Date: 11/16/2017 CLINICAL DATA:  Elevated liver function test with sepsis. EXAM: ULTRASOUND ABDOMEN LIMITED RIGHT UPPER QUADRANT COMPARISON:  None. FINDINGS: Gallbladder: The gallbladder is physiologically distended without focal mural thickening. Gallbladder wall is 2.2 mm in single wall thickness which is within normal limits. There is layering biliary sludge with a mobile 1.9 cm gallstone noted near the neck. No sonographic Murphy sign noted by sonographer. Common bile duct: Diameter: Ranged in size from 6.2 mm approximately the 10.4 mm distally. No choledocholithiasis. Liver: No focal lesion identified. Within normal limits in parenchymal echogenicity. Portal vein is patent on color Doppler imaging with normal direction of blood flow towards the liver. IMPRESSION: Uncomplicated cholelithiasis with biliary sludge. Electronically Signed   By: Tollie Eth M.D.   On: 11/16/2017 19:25     Assessment/Plan: 77 year old female with a history of dementia presenting with altered mental status.  Initially febrile and felt to be septic.  Was noted to have seizure-like activity on 7/10.  Etiology unclear.  MRI of the brain reviewed and shows some atrophy but no explanation for seizure or altered mental status.  Although may be related to dementia, will rule out other possibilities.    Recommendations: 1.  Seizure  precautions 2.  Ativan prn seizures 3.  Would continue Keppra at current dose.  If no improvement in mental status by tomorrow would consider change in anticonvulsant since it may be causing side effects. 4.  EEG  Thana Farr, MD Neurology [phone] 11/18/2017, 2:52 PM

## 2017-11-18 NOTE — Progress Notes (Signed)
   11/18/17 1055  Clinical Encounter Type  Visited With Patient and family together  Visit Type Follow-up  Spiritual Encounters  Spiritual Needs Emotional   Based on on-call chaplain report and morning unit report, chaplain followed up with patient and family.  Family on telephone, but engaged.  Chaplain spoke aloud to patient as well as family though patient not able to respond.  No needs at present; family expressed openness to ongoing follow up.

## 2017-11-19 LAB — GLUCOSE, CAPILLARY
GLUCOSE-CAPILLARY: 140 mg/dL — AB (ref 70–99)
GLUCOSE-CAPILLARY: 124 mg/dL — AB (ref 70–99)
Glucose-Capillary: 118 mg/dL — ABNORMAL HIGH (ref 70–99)
Glucose-Capillary: 81 mg/dL (ref 70–99)
Glucose-Capillary: 83 mg/dL (ref 70–99)

## 2017-11-19 MED ORDER — INSULIN ASPART 100 UNIT/ML ~~LOC~~ SOLN
3.0000 [IU] | Freq: Three times a day (TID) | SUBCUTANEOUS | Status: DC
  Filled 2017-11-19: qty 1

## 2017-11-19 MED ORDER — VALPROATE SODIUM 500 MG/5ML IV SOLN
1000.0000 mg | Freq: Once | INTRAVENOUS | Status: AC
  Administered 2017-11-19: 23:00:00 1000 mg via INTRAVENOUS
  Filled 2017-11-19: qty 10

## 2017-11-19 MED ORDER — VALPROATE SODIUM 500 MG/5ML IV SOLN
500.0000 mg | Freq: Two times a day (BID) | INTRAVENOUS | Status: DC
  Administered 2017-11-20 (×2): 500 mg via INTRAVENOUS
  Filled 2017-11-19 (×4): qty 5

## 2017-11-19 MED ORDER — ENOXAPARIN SODIUM 40 MG/0.4ML ~~LOC~~ SOLN
40.0000 mg | Freq: Two times a day (BID) | SUBCUTANEOUS | Status: DC
  Administered 2017-11-19 – 2017-11-22 (×7): 40 mg via SUBCUTANEOUS
  Filled 2017-11-19 (×8): qty 0.4

## 2017-11-19 MED ORDER — SODIUM CHLORIDE 0.9 % IV SOLN
100.0000 mg | Freq: Two times a day (BID) | INTRAVENOUS | Status: AC
  Administered 2017-11-19 – 2017-11-25 (×14): 100 mg via INTRAVENOUS
  Filled 2017-11-19 (×14): qty 100

## 2017-11-19 MED ORDER — IPRATROPIUM-ALBUTEROL 0.5-2.5 (3) MG/3ML IN SOLN
3.0000 mL | RESPIRATORY_TRACT | Status: DC | PRN
  Administered 2017-11-19 – 2017-11-20 (×2): 3 mL via RESPIRATORY_TRACT
  Filled 2017-11-19 (×2): qty 3

## 2017-11-19 MED ORDER — INSULIN ASPART 100 UNIT/ML ~~LOC~~ SOLN
0.0000 [IU] | Freq: Three times a day (TID) | SUBCUTANEOUS | Status: DC
  Administered 2017-11-19: 1 [IU] via SUBCUTANEOUS
  Administered 2017-11-21 – 2017-11-22 (×2): 2 [IU] via SUBCUTANEOUS
  Administered 2017-11-22: 12:00:00 1 [IU] via SUBCUTANEOUS
  Administered 2017-11-22 – 2017-11-23 (×3): 2 [IU] via SUBCUTANEOUS
  Administered 2017-11-23: 1 [IU] via SUBCUTANEOUS
  Administered 2017-11-24 (×3): 2 [IU] via SUBCUTANEOUS
  Administered 2017-11-25 – 2017-11-26 (×3): 1 [IU] via SUBCUTANEOUS
  Filled 2017-11-19 (×14): qty 1

## 2017-11-19 MED ORDER — LACTATED RINGERS IV SOLN
INTRAVENOUS | Status: DC
  Administered 2017-11-19: 11:00:00 via INTRAVENOUS

## 2017-11-19 MED ORDER — INSULIN ASPART 100 UNIT/ML ~~LOC~~ SOLN: 0.0000 [IU] | Freq: Every day | SUBCUTANEOUS | Status: DC

## 2017-11-19 NOTE — Progress Notes (Signed)
Spoke with Dr. Sung AmabileSimonds about patient being lethargic and high risk for aspiration with food and med's.  Per MD hold Med's until patient is more awake.  Seroquel discontinued due to potential cause of sleepiness.

## 2017-11-19 NOTE — Progress Notes (Signed)
Upon arrival to room 120

## 2017-11-19 NOTE — Progress Notes (Signed)
[employer] [facility] [place] [facility]                                                                                                                                                                                  Patient Demographics   Sheena Mitchell, is a 77 y.o. female, DOB - 22-Nov-1940, ZOX:[id number]  Admit date - 11/16/2017   Admitting Physician Altamese Dilling, MD  Outpatient Primary MD for the patient is Center, [facility]   LOS - 3  Subjective: Patient still drowsy able to open eyes does not follow command  Review of Systems:   CONSTITUTIONAL: Sleepy Vitals:   Vitals:   11/19/17 1200 11/19/17 1300 11/19/17 1400 11/19/17 1500  BP: (!) 178/81 (!) 153/85 (!) 170/82 (!) 164/93  Pulse: 72 69 72 73  Resp: 15 18 (!) 23 (!) 22  Temp:      TempSrc:      SpO2: 96% 96% 96% 97%  Weight:      Height:        Wt Readings from Last 3 Encounters:  11/16/17 113.4 kg (250 lb)  10/09/15 99.8 kg (220 lb)  10/02/15 99.8 kg (220 lb)     Intake/Output Summary (Last 24 hours) at 11/19/2017 1508 Last data filed at 11/19/2017 1500 Gross per 24 hour  Intake 2663.75 ml  Output 685 ml  Net 1978.75 ml    Physical Exam:   GENERAL: Critically ill  HEAD, EYES, EARS, NOSE AND THROAT: Atraumatic, normocephalic. . Pupils equal and reactive to light. Sclerae anicteric. No conjunctival injection. No oro-pharyngeal erythema.  NECK: Supple. There is no jugular venous distention. No bruits, no lymphadenopathy, no thyromegaly.  HEART: Regular rate and rhythm,. No murmurs, no rubs, no clicks.  LUNGS: Clear to auscultation bilaterally. No rales or rhonchi. No wheezes.  ABDOMEN: Soft, flat, nontender, nondistended. Has good bowel sounds. No hepatosplenomegaly appreciated.  EXTREMITIES: No evidence of any cyanosis, clubbing, or peripheral edema.  +2 pedal and radial pulses bilaterally.  NEUROLOGIC: Poorly responsive SKIN: Moist and warm with no rashes appreciated.   Psych: Not anxious, depressed LN: No inguinal LN enlargement    Antibiotics   Anti-infectives (From admission, onward)   Start     Dose/Rate Route Frequency Ordered Stop   11/19/17 1000  doxycycline (VIBRAMYCIN) 100 mg in sodium chloride 0.9 % 250 mL IVPB     100 mg 125 mL/hr over 120 Minutes Intravenous Every 12 hours 11/19/17 0850     11/18/17 1600  Ampicillin-Sulbactam (UNASYN) 3 g in sodium chloride 0.9 % 100 mL IVPB  Status:  Discontinued     3 g 200 mL/hr over 30 Minutes Intravenous Every 6 hours  11/18/17 1552 11/19/17 0850   11/17/17 2200  vancomycin (VANCOCIN) IVPB 1000 mg/200 mL premix  Status:  Discontinued     1,000 mg 200 mL/hr over 60 Minutes Intravenous Every 18 hours 11/17/17 2002 11/17/17 2106   11/17/17 1100  piperacillin-tazobactam (ZOSYN) IVPB 3.375 g  Status:  Discontinued     3.375 g 12.5 mL/hr over 240 Minutes Intravenous Every 8 hours 11/17/17 0832 11/18/17 1046   11/17/17 1000  vancomycin (VANCOCIN) 1,250 mg in sodium chloride 0.9 % 250 mL IVPB  Status:  Discontinued     1,250 mg 166.7 mL/hr over 90 Minutes Intravenous Every 24 hours 11/17/17 0832 11/17/17 1432   11/16/17 1715  piperacillin-tazobactam (ZOSYN) IVPB 3.375 g     3.375 g 100 mL/hr over 30 Minutes Intravenous  Once 11/16/17 1701 11/16/17 1750   11/16/17 1715  vancomycin (VANCOCIN) IVPB 1000 mg/200 mL premix     1,000 mg 200 mL/hr over 60 Minutes Intravenous  Once 11/16/17 1701 11/16/17 1900      Medications   Scheduled Meds: . aspirin EC  81 mg Oral Daily  . enoxaparin (LOVENOX) injection  40 mg Subcutaneous BID  . feeding supplement (ENSURE ENLIVE)  237 mL Oral BID BM  . insulin aspart  0-5 Units Subcutaneous QHS  . insulin aspart  0-9 Units Subcutaneous TID WC  . insulin aspart  3 Units Subcutaneous TID WC  . memantine  10 mg Oral BID  . multivitamin with minerals   Oral Daily  . nystatin  5 mL Mouth/Throat QID   Continuous Infusions: . doxycycline (VIBRAMYCIN) IV Stopped (11/19/17  1338)  . lactated ringers 50 mL/hr at 11/19/17 1123  . levETIRAcetam Stopped (11/19/17 0448)   PRN Meds:.acetaminophen, hydrALAZINE, ipratropium-albuterol   Data Review:   Micro Results Recent Results (from the past 240 hour(s))  Urine culture     Status: None   Collection Time: 11/16/17  4:48 PM  Result Value Ref Range Status   Specimen Description   Final    URINE, RANDOM Performed at [facility], [address] [place], [place] [address]    Special Requests   Final    NONE Performed at [facility], [address] [place], [place] [address]    Culture   Final    NO GROWTH Performed at [facility], [address] [place], [place] [address]    Report Status 11/17/2017 FINAL  Final  Culture, blood (Routine x 2)     Status: None (Preliminary result)   Collection Time: 11/16/17  4:57 PM  Result Value Ref Range Status   Specimen Description BLOOD LEFT FOREARM  Final   Special Requests   Final    BOTTLES DRAWN AEROBIC AND ANAEROBIC Blood Culture results may not be optimal due to an inadequate volume of blood received in culture bottles   Culture   Final    NO GROWTH 3 DAYS Performed at [facility], [address] [place], [place] [address]    Report Status PENDING  Incomplete  Culture, blood (Routine x 2)     Status: None (Preliminary result)   Collection Time: 11/16/17  5:02 PM  Result Value Ref Range Status   Specimen Description BLOOD RIGHT FOREARM  Final   Special Requests   Final    BOTTLES DRAWN AEROBIC AND ANAEROBIC Blood Culture results may not be optimal due to an inadequate volume of blood received in culture bottles   Culture   Final    NO  GROWTH 3 DAYS Performed at [facility], [address] [place], [place] [address]    Report Status PENDING  Incomplete  Respiratory Panel by PCR     Status: None   Collection Time: 11/17/17  8:41 AM  Result Value Ref Range Status   Adenovirus NOT DETECTED NOT DETECTED  Final   Coronavirus 229E NOT DETECTED NOT DETECTED Final   Coronavirus HKU1 NOT DETECTED NOT DETECTED Final   Coronavirus NL63 NOT DETECTED NOT DETECTED Final   Coronavirus OC43 NOT DETECTED NOT DETECTED Final   Metapneumovirus NOT DETECTED NOT DETECTED Final   Rhinovirus / Enterovirus NOT DETECTED NOT DETECTED Final   Influenza A NOT DETECTED NOT DETECTED Final   Influenza B NOT DETECTED NOT DETECTED Final   Parainfluenza Virus 1 NOT DETECTED NOT DETECTED Final   Parainfluenza Virus 2 NOT DETECTED NOT DETECTED Final   Parainfluenza Virus 3 NOT DETECTED NOT DETECTED Final   Parainfluenza Virus 4 NOT DETECTED NOT DETECTED Final   Respiratory Syncytial Virus NOT DETECTED NOT DETECTED Final   Bordetella pertussis NOT DETECTED NOT DETECTED Final   Chlamydophila pneumoniae NOT DETECTED NOT DETECTED Final   Mycoplasma pneumoniae NOT DETECTED NOT DETECTED Final    Comment: Performed at [facility], [address], [place], [place] [address]  MRSA PCR Screening     Status: None   Collection Time: 11/17/17  6:54 PM  Result Value Ref Range Status   MRSA by PCR NEGATIVE NEGATIVE Final    Comment:        The GeneXpert MRSA Assay (FDA approved for NASAL specimens only), is one component of a comprehensive MRSA colonization surveillance program. It is not intended to diagnose MRSA infection nor to guide or monitor treatment for MRSA infections. Performed at [facility], [address] [place], [place] [address]     Radiology Reports Ct Head Wo Contrast  Result Date: 11/17/2017 CLINICAL DATA:  77 y/o F; acute mental status change. Fever and suspected seizure. EXAM: CT HEAD WITHOUT CONTRAST TECHNIQUE: Contiguous axial images were obtained from the base of the skull through the vertex without intravenous contrast. COMPARISON:  11/16/2017 CT head FINDINGS: Brain: No evidence of acute infarction, hemorrhage, hydrocephalus, extra-axial collection or mass lesion/mass effect.  Stable chronic microvascular ischemic changes and parenchymal volume loss of the brain. Vascular: Calcific atherosclerosis of carotid siphons. No hyperdense vessel. Skull: Normal. Negative for fracture or focal lesion. Sinuses/Orbits: No acute finding. Other: None. IMPRESSION: 1. No acute intracranial abnormality identified. 2. Stable chronic microvascular ischemic changes and parenchymal volume loss of the brain. Electronically Signed   By: Mitzi Hansen M.D.   On: 11/17/2017 18:41   Ct Head Wo Contrast  Result Date: 11/16/2017 CLINICAL DATA:  Dementia. Altered mental status and progressive weakness. EXAM: CT HEAD WITHOUT CONTRAST TECHNIQUE: Contiguous axial images were obtained from the base of the skull through the vertex without intravenous contrast. COMPARISON:  08/05/2017 FINDINGS: Brain: Mild age related involutional changes of the brain with chronic minimal small vessel ischemia of periventricular and subcortical white matter. No acute intracranial hemorrhage, mass or midline shift. No large vascular territory infarct. No extra-axial fluid collections. Midline fourth ventricle and basal cisterns without effacement. The brainstem and cerebellum are stable. Calcifications along the tentorium are stable. Vascular: No hyperdense vessel sign. Skull: Intact Sinuses/Orbits: Nonacute Other: None IMPRESSION: Minimal chronic small vessel ischemic disease. No acute intracranial abnormality. Electronically Signed   By: Tollie Eth M.D.   On: 11/16/2017 21:12   Mr  Brain Wo Contrast  Result Date: 11/18/2017 CLINICAL DATA:  77 y/o F; found down. History of dementia and hypertension. Sepsis. EXAM: MRI HEAD WITHOUT CONTRAST TECHNIQUE: Multiplanar, multiecho pulse sequences of the brain and surrounding structures were obtained without intravenous contrast. COMPARISON:  11/17/2017 CT head.  10/09/2015 MRI head. FINDINGS: Brain: Extensive motion degradation on multiple sequences. No reduced diffusion to  suggest acute or early subacute infarction. Stable nonspecific foci of T2 FLAIR hyperintense signal abnormality in subcortical and periventricular white matter are compatible with mild chronic microvascular ischemic changes for age. Mild brain parenchymal volume loss. No focal mass effect, extra-axial collection, hydrocephalus, or herniation. Punctate stable focus of susceptibility hypointensity in left cerebellar hemisphere compatible with chronic hemosiderin deposition from microhemorrhage. Vascular: Normal flow voids. Skull and upper cervical spine: Normal marrow signal. Sinuses/Orbits: Right maxillary sinus mucous retention cyst. No significant abnormal signal of mastoid air cells. Orbits are unremarkable. Other: None. IMPRESSION: 1. Motion degraded study. 2. No acute intracranial abnormality identified. 3. Stable mild chronic microvascular ischemic changes and parenchymal volume loss of the brain. Electronically Signed   By: Mitzi Hansen M.D.   On: 11/18/2017 14:33   Dg Chest Port 1 View  Result Date: 11/17/2017 CLINICAL DATA:  Fever, unresponsive. EXAM: PORTABLE CHEST 1 VIEW COMPARISON:  Radiograph of November 16, 2017. FINDINGS: The heart size and mediastinal contours are within normal limits. Atherosclerosis of thoracic aorta is noted. Hypoinflation of the lungs is noted with minimal bibasilar subsegmental atelectasis. The visualized skeletal structures are unremarkable. IMPRESSION: Hypoinflation of lungs with minimal bibasilar subsegmental atelectasis. Aortic Atherosclerosis (ICD10-I70.0). Electronically Signed   By: Lupita Raider, M.D.   On: 11/17/2017 18:07   Dg Chest Port 1 View  Result Date: 11/16/2017 CLINICAL DATA:  77 y/o F; altered mental status. Abnormal breath sounds. Sepsis. EXAM: PORTABLE CHEST 1 VIEW COMPARISON:  None. FINDINGS: Normal cardiac silhouette. Aortic atherosclerosis with calcification. Mild bronchitic changes. No focal consolidation no pleural effusion or  pneumothorax. No acute osseous abnormality is evident. IMPRESSION: Mild bronchitic changes. No focal consolidation. Aortic atherosclerosis. Electronically Signed   By: Mitzi Hansen M.D.   On: 11/16/2017 17:27   US Abdomen Limited Ruq  Result Date: 11/16/2017 CLINICAL DATA:  Elevated liver function test with sepsis. EXAM: ULTRASOUND ABDOMEN LIMITED RIGHT UPPER QUADRANT COMPARISON:  None. FINDINGS: Gallbladder: The gallbladder is physiologically distended without focal mural thickening. Gallbladder wall is 2.2 mm in single wall thickness which is within normal limits. There is layering biliary sludge with a mobile 1.9 cm gallstone noted near the neck. No sonographic Murphy sign noted by sonographer. Common bile duct: Diameter: Ranged in size from 6.2 mm approximately the 10.4 mm distally. No choledocholithiasis. Liver: No focal lesion identified. Within normal limits in parenchymal echogenicity. Portal vein is patent on color Doppler imaging with normal direction of blood flow towards the liver. IMPRESSION: Uncomplicated cholelithiasis with biliary sludge. Electronically Signed   By: Tollie Eth M.D.   On: 11/16/2017 19:25     CBC Recent Labs  Lab 11/16/17 1657 11/17/17 0506 11/18/17 0630  WBC 5.2 8.7 8.6  HGB 12.4 12.8 11.2*  HCT 37.1 38.9 33.5*  PLT 131* 120* 108*  MCV 89.3 91.6 89.8  MCH 30.0 30.1 30.1  MCHC 33.6 32.9 33.6  RDW 15.2* 15.5* 15.7*  LYMPHSABS 0.5*  --  0.3*  MONOABS 0.4  --  0.5  EOSABS 0.0  --  0.0  BASOSABS 0.0  --  0.0    Chemistries  Recent Labs  Lab  11/16/17 1657 11/17/17 0506 11/18/17 0630  NA 140 143 139  K 3.9 4.2 3.7  CL 108 112* 108  CO2 23 22 23   GLUCOSE 91 92 317*  BUN 32* 29* 21  CREATININE 1.56* 1.45* 1.06*  CALCIUM 8.9 8.7* 8.8*  AST 142* 93* 162*  ALT 316* 240* 246*  ALKPHOS 158* 135* 320*  BILITOT 2.9* 2.3* 4.4*    ------------------------------------------------------------------------------------------------------------------ estimated creatinine clearance is 58.5 mL/min (A) (by C-G formula based on SCr of 1.06 mg/dL (H)). ------------------------------------------------------------------------------------------------------------------ Recent Labs    11/18/17 0630  HGBA1C 6.0*   ------------------------------------------------------------------------------------------------------------------ No results for input(s): CHOL, HDL, LDLCALC, TRIG, CHOLHDL, LDLDIRECT in the last 72 hours. ------------------------------------------------------------------------------------------------------------------ No results for input(s): TSH, T4TOTAL, T3FREE, THYROIDAB in the last 72 hours.  Invalid input(s): FREET3 ------------------------------------------------------------------------------------------------------------------ No results for input(s): VITAMINB12, FOLATE, FERRITIN, TIBC, IRON, RETICCTPCT in the last 72 hours.  Coagulation profile Recent Labs  Lab 11/16/17 2246  INR 1.14    No results for input(s): DDIMER in the last 72 hours.  Cardiac Enzymes Recent Labs  Lab 11/16/17 2246  TROPONINI <0.03   ------------------------------------------------------------------------------------------------------------------ Invalid input(s): POCBNP    Assessment & Plan   *Sepsis Fever of unknown origin cultures negative   continue IV antibiotics for now cultures negative Continue doxycycline   *Altered mental status likely metabolic encephalopathy  MRI without stroke EEG negative  *Seizure continue IV Keppra EEG negative and neurology consult appreciated   *Dementia Continue baseline home medicines.  *Acute on chronic renal insufficiency Improved with IV fluid  *Elevated LFT Hepatitis panel negative Ultrasound shows no liver abnormality  *Essential hypertension not on any  medications continue monitor  *Hyperlipidemia cholesterol medications discontinued  Prognosis very poor      Code Status Orders  (From admission, onward)        Start     Ordered   11/16/17 2220  Do not attempt resuscitation (DNR)  Continuous    Question Answer Comment  In the event of cardiac or respiratory ARREST Do not call a "code blue"   In the event of cardiac or respiratory ARREST Do not perform Intubation, CPR, defibrillation or ACLS   In the event of cardiac or respiratory ARREST Use medication by any route, position, wound care, and other measures to relive pain and suffering. May use oxygen, suction and manual treatment of airway obstruction as needed for comfort.      11/16/17 2219    Code Status History    This patient has a current code status but no historical code status.           Consults none  DVT Prophylaxis  Lovenox   Lab Results  Component Value Date   PLT 108 (L) 11/18/2017     Time Spent in minutes   35 minutes greater than 50% of time spent in care coordination and counseling patient regarding the condition and plan of care.   Auburn Bilberry M.D on 11/19/2017 at 3:08 PM  Between 7am to 6pm - Pager - [phone]  After 6pm go to [email or web address] - Social research officer, government  [employer]   Office  [phone]

## 2017-11-19 NOTE — Progress Notes (Signed)
Subjective: Patient more talkative but keeps eyes closed.  Objective: Current vital signs: BP (!) 164/93   Pulse 73   Temp 97.9 F (36.6 C)   Resp (!) 22   Ht 5\' 5"  (1.651 m)   Wt 113.4 kg (250 lb)   SpO2 97%   BMI 41.60 kg/m  Vital signs in last 24 hours: Temp:  [97.9 F (36.6 C)-98.4 F (36.9 C)] 97.9 F (36.6 C) (07/12 0730) Pulse Rate:  [57-79] 73 (07/12 1500) Resp:  [13-23] 22 (07/12 1500) BP: (123-185)/(65-127) 164/93 (07/12 1500) SpO2:  [96 %-100 %] 97 % (07/12 1500)  Intake/Output from previous day: 07/11 0701 - 07/12 0700 In: 1500 [I.V.:1500] Out: 585 [Urine:585] Intake/Output this shift: Total I/O In: 1163.8 [I.V.:884.6; IV Piggyback:279.2] Out: 100 [Urine:100] Nutritional status:  Diet Order           DIET - DYS 1 Room service appropriate? Yes with Assist; Fluid consistency: Thin  Diet effective now          Neurologic Exam: Mental Status: Eyes closed.  Speaks in response to questioning.  Does not follow commands.   Cranial Nerves: II: Discs flat bilaterally; Pupils equal, round, reactive to light and accommodation III,IV,VI: doll's response absent bilaterally.  V,VII: corneal reflex present bilaterally  VIII: patient does not respond to verbal stimuli IX,X: gag reflex unable to be tested, XI: trapezius strength unable to test bilaterally XII: tongue strength unable to test Motor: Resists active movement of extremities   Lab Results: Basic Metabolic Panel: Recent Labs  Lab 11/16/17 1657 11/17/17 0506 11/18/17 0630  NA 140 143 139  K 3.9 4.2 3.7  CL 108 112* 108  CO2 23 22 23   GLUCOSE 91 92 317*  BUN 32* 29* 21  CREATININE 1.56* 1.45* 1.06*  CALCIUM 8.9 8.7* 8.8*    Liver Function Tests: Recent Labs  Lab 11/16/17 1657 11/17/17 0506 11/18/17 0630  AST 142* 93* 162*  ALT 316* 240* 246*  ALKPHOS 158* 135* 320*  BILITOT 2.9* 2.3* 4.4*  PROT 7.6 7.0 7.0  ALBUMIN 3.1* 2.8* 2.5*   No results for input(s): LIPASE, AMYLASE in the  last 168 hours. No results for input(s): AMMONIA in the last 168 hours.  CBC: Recent Labs  Lab 11/16/17 1657 11/17/17 0506 11/18/17 0630  WBC 5.2 8.7 8.6  NEUTROABS 4.3  --  7.8*  HGB 12.4 12.8 11.2*  HCT 37.1 38.9 33.5*  MCV 89.3 91.6 89.8  PLT 131* 120* 108*    Cardiac Enzymes: Recent Labs  Lab 11/16/17 2246  TROPONINI <0.03    Lipid Panel: No results for input(s): CHOL, TRIG, HDL, CHOLHDL, VLDL, LDLCALC in the last 168 hours.  CBG: Recent Labs  Lab 11/18/17 2355 11/19/17 0338 11/19/17 0737 11/19/17 1152 11/19/17 1630  GLUCAP 150* 140* 118* 124* 81    Microbiology: Results for orders placed or performed during the hospital encounter of 11/16/17  Urine culture     Status: None   Collection Time: 11/16/17  4:48 PM  Result Value Ref Range Status   Specimen Description   Final    URINE, RANDOM Performed at [facility], [address] [place], [place] [address]    Special Requests   Final    NONE Performed at [facility], [address] [place], [place] [address]    Culture   Final    NO GROWTH Performed at [facility], [address] [place], [place] [address]    Report Status 11/17/2017 FINAL  Final  Culture, blood (Routine x 2)     Status: None (Preliminary result)   Collection Time: 11/16/17  4:57 PM  Result Value Ref Range Status   Specimen Description BLOOD LEFT FOREARM  Final   Special Requests   Final    BOTTLES DRAWN AEROBIC AND ANAEROBIC Blood Culture results may not be optimal due to an inadequate volume of blood received in culture bottles   Culture   Final    NO GROWTH 3 DAYS Performed at [facility], [address], [place], [place] [address]    Report Status PENDING  Incomplete  Culture, blood (Routine x 2)     Status: None (Preliminary result)   Collection Time: 11/16/17  5:02 PM  Result Value Ref Range Status   Specimen Description BLOOD RIGHT FOREARM  Final   Special Requests   Final     BOTTLES DRAWN AEROBIC AND ANAEROBIC Blood Culture results may not be optimal due to an inadequate volume of blood received in culture bottles   Culture   Final    NO GROWTH 3 DAYS Performed at [facility], [address] [place], [place] [address]    Report Status PENDING  Incomplete  Respiratory Panel by PCR     Status: None   Collection Time: 11/17/17  8:41 AM  Result Value Ref Range Status   Adenovirus NOT DETECTED NOT DETECTED Final   Coronavirus 229E NOT DETECTED NOT DETECTED Final   Coronavirus HKU1 NOT DETECTED NOT DETECTED Final   Coronavirus NL63 NOT DETECTED NOT DETECTED Final   Coronavirus OC43 NOT DETECTED NOT DETECTED Final   Metapneumovirus NOT DETECTED NOT DETECTED Final   Rhinovirus / Enterovirus NOT DETECTED NOT DETECTED Final   Influenza A NOT DETECTED NOT DETECTED Final   Influenza B NOT DETECTED NOT DETECTED Final   Parainfluenza Virus 1 NOT DETECTED NOT DETECTED Final   Parainfluenza Virus 2 NOT DETECTED NOT DETECTED Final   Parainfluenza Virus 3 NOT DETECTED NOT DETECTED Final   Parainfluenza Virus 4 NOT DETECTED NOT DETECTED Final   Respiratory Syncytial Virus NOT DETECTED NOT DETECTED Final   Bordetella pertussis NOT DETECTED NOT DETECTED Final   Chlamydophila pneumoniae NOT DETECTED NOT DETECTED Final   Mycoplasma pneumoniae NOT DETECTED NOT DETECTED Final    Comment: Performed at [facility], [address] [place], [place] [address]  MRSA PCR Screening     Status: None   Collection Time: 11/17/17  6:54 PM  Result Value Ref Range Status   MRSA by PCR NEGATIVE NEGATIVE Final    Comment:        The GeneXpert MRSA Assay (FDA approved for NASAL specimens only), is one component of a comprehensive MRSA colonization surveillance program. It is not intended to diagnose MRSA infection nor to guide or monitor treatment for MRSA infections. Performed at [facility], [address] [place], [place] [address]     Coagulation  Studies: Recent Labs    11/16/17 2246  LABPROT 14.5  INR 1.14    Imaging: Ct Head Wo Contrast  Result Date: 11/17/2017 CLINICAL DATA:  77 y/o F; acute mental status change. Fever and suspected seizure. EXAM: CT HEAD WITHOUT CONTRAST TECHNIQUE: Contiguous axial images were obtained from the base of the skull through the vertex without intravenous contrast. COMPARISON:  11/16/2017 CT head FINDINGS: Brain: No evidence of acute infarction, hemorrhage, hydrocephalus, extra-axial collection or mass lesion/mass effect. Stable chronic microvascular ischemic changes and parenchymal volume loss of the brain.  Vascular: Calcific atherosclerosis of carotid siphons. No hyperdense vessel. Skull: Normal. Negative for fracture or focal lesion. Sinuses/Orbits: No acute finding. Other: None. IMPRESSION: 1. No acute intracranial abnormality identified. 2. Stable chronic microvascular ischemic changes and parenchymal volume loss of the brain. Electronically Signed   By: Mitzi HansenLance  Furusawa-Stratton M.D.   On: 11/17/2017 18:41   Mr Brain Wo Contrast  Result Date: 11/18/2017 CLINICAL DATA:  77 y/o F; found down. History of dementia and hypertension. Sepsis. EXAM: MRI HEAD WITHOUT CONTRAST TECHNIQUE: Multiplanar, multiecho pulse sequences of the brain and surrounding structures were obtained without intravenous contrast. COMPARISON:  11/17/2017 CT head.  10/09/2015 MRI head. FINDINGS: Brain: Extensive motion degradation on multiple sequences. No reduced diffusion to suggest acute or early subacute infarction. Stable nonspecific foci of T2 FLAIR hyperintense signal abnormality in subcortical and periventricular white matter are compatible with mild chronic microvascular ischemic changes for age. Mild brain parenchymal volume loss. No focal mass effect, extra-axial collection, hydrocephalus, or herniation. Punctate stable focus of susceptibility hypointensity in left cerebellar hemisphere compatible with chronic hemosiderin  deposition from microhemorrhage. Vascular: Normal flow voids. Skull and upper cervical spine: Normal marrow signal. Sinuses/Orbits: Right maxillary sinus mucous retention cyst. No significant abnormal signal of mastoid air cells. Orbits are unremarkable. Other: None. IMPRESSION: 1. Motion degraded study. 2. No acute intracranial abnormality identified. 3. Stable mild chronic microvascular ischemic changes and parenchymal volume loss of the brain. Electronically Signed   By: Mitzi HansenLance  Furusawa-Stratton M.D.   On: 11/18/2017 14:33   Dg Chest Port 1 View  Result Date: 11/17/2017 CLINICAL DATA:  Fever, unresponsive. EXAM: PORTABLE CHEST 1 VIEW COMPARISON:  Radiograph of November 16, 2017. FINDINGS: The heart size and mediastinal contours are within normal limits. Atherosclerosis of thoracic aorta is noted. Hypoinflation of the lungs is noted with minimal bibasilar subsegmental atelectasis. The visualized skeletal structures are unremarkable. IMPRESSION: Hypoinflation of lungs with minimal bibasilar subsegmental atelectasis. Aortic Atherosclerosis (ICD10-I70.0). Electronically Signed   By: Lupita RaiderJames  Green Jr, M.D.   On: 11/17/2017 18:07    Medications:  I have reviewed the patient's current medications. Scheduled: . aspirin EC  81 mg Oral Daily  . enoxaparin (LOVENOX) injection  40 mg Subcutaneous BID  . feeding supplement (ENSURE ENLIVE)  237 mL Oral BID BM  . insulin aspart  0-5 Units Subcutaneous QHS  . insulin aspart  0-9 Units Subcutaneous TID WC  . insulin aspart  3 Units Subcutaneous TID WC  . memantine  10 mg Oral BID  . multivitamin with minerals   Oral Daily  . nystatin  5 mL Mouth/Throat QID    Assessment/Plan: Patient more interactive today but does not follow commands or open eyes.  No further seizures noted.  On Keppra.  Unclear if having side effects related to Keppra.   EEG shows no epileptiform activity.  Recommendations 1.  Start Depakote 1g load with maintenace 500mg  q 12 hours 2.   Depakote level in AM 3.  Once Depakote therapeutic would discontinue Keppra.   4.  Continue seizure precautions   LOS: 3 days   Thana FarrLeslie Preslie Depasquale, MD Neurology [phone] 11/19/2017  4:52 PM

## 2017-11-19 NOTE — Progress Notes (Signed)
After transfer from ICU bed to floor bed patient tachypnic and has expiratory wheezes.  No change in vital signs.  Cardiopulmonary called for PRN breathing treatment.

## 2017-11-19 NOTE — Progress Notes (Signed)
Anticoagulation monitoring(Lovenox):  76yo  female ordered Lovenox 40 mg Q24h  Filed Weights   11/16/17 1639  Weight: 250 lb (113.4 kg)   BMI 41.7   Lab Results  Component Value Date   CREATININE 1.06 (H) 11/18/2017   CREATININE 1.45 (H) 11/17/2017   CREATININE 1.56 (H) 11/16/2017   Estimated Creatinine Clearance: 58.5 mL/min (A) (by C-G formula based on SCr of 1.06 mg/dL (H)). Hemoglobin & Hematocrit     Component Value Date/Time   HGB 11.2 (L) 11/18/2017 0630   HCT 33.5 (L) 11/18/2017 0630     Per Protocol for Patient with estCrcl > 30 ml/min and BMI > 40, will transition to Lovenox 40 mg BID.

## 2017-11-19 NOTE — Progress Notes (Signed)
Telephone report called to Duwayne Heckanielle, Charity fundraiserN.  Patient transported via bed with RN present.

## 2017-11-20 ENCOUNTER — Inpatient Hospital Stay: Payer: Medicare (Managed Care)

## 2017-11-20 LAB — VALPROIC ACID LEVEL: Valproic Acid Lvl: 44 ug/mL — ABNORMAL LOW (ref 50.0–100.0)

## 2017-11-20 LAB — GLUCOSE, CAPILLARY
GLUCOSE-CAPILLARY: 88 mg/dL (ref 70–99)
Glucose-Capillary: 91 mg/dL (ref 70–99)
Glucose-Capillary: 89 mg/dL (ref 70–99)
Glucose-Capillary: 106 mg/dL — ABNORMAL HIGH (ref 70–99)

## 2017-11-20 MED ORDER — MODAFINIL 100 MG PO TABS
200.0000 mg | ORAL_TABLET | Freq: Every day | ORAL | Status: DC
  Administered 2017-11-26: 09:00:00 200 mg via ORAL
  Filled 2017-11-20: qty 2

## 2017-11-20 MED ORDER — SCOPOLAMINE 1 MG/3DAYS TD PT72
1.0000 | MEDICATED_PATCH | TRANSDERMAL | Status: DC
  Administered 2017-11-20 – 2017-11-23 (×2): 1.5 mg via TRANSDERMAL
  Filled 2017-11-20 (×3): qty 1

## 2017-11-20 MED ORDER — IPRATROPIUM-ALBUTEROL 0.5-2.5 (3) MG/3ML IN SOLN
3.0000 mL | Freq: Four times a day (QID) | RESPIRATORY_TRACT | Status: DC
  Administered 2017-11-20 (×2): 3 mL via RESPIRATORY_TRACT
  Filled 2017-11-20 (×2): qty 3

## 2017-11-20 NOTE — Progress Notes (Signed)
Subjective: No talking today.  Eyes remain closed.  No seizures noted.    Objective: Current vital signs: BP (!) 169/98 (BP Location: Left Arm)   Pulse 72   Temp (!) 97 F (36.1 C) (Axillary)   Resp 20   Ht 5\' 5"  (1.651 m)   Wt 103.5 kg (228 lb 2 oz)   SpO2 99%   BMI 37.96 kg/m  Vital signs in last 24 hours: Temp:  [97 F (36.1 C)-98.6 F (37 C)] 97 F (36.1 C) (07/13 0429) Pulse Rate:  [69-84] 72 (07/13 0844) Resp:  [18-24] 20 (07/13 0844) BP: (139-170)/(67-98) 169/98 (07/13 0844) SpO2:  [93 %-99 %] 99 % (07/13 0844) Weight:  [103.5 kg (228 lb 2 oz)] 103.5 kg (228 lb 2 oz) (07/12 1814)  Intake/Output from previous day: 07/12 0701 - 07/13 0700 In: 2292.3 [I.V.:1484.6; IV Piggyback:707.7] Out: 400 [Urine:400] Intake/Output this shift: No intake/output data recorded. Nutritional status:  Diet Order           Diet NPO time specified Except for: Ice Chips, Sips with Meds  Diet effective now          Neurologic Exam: Mental Status: Eyes closed. No speaking but some groaning noted. Does not follow commands.  Cranial Nerves: II: Discs flat bilaterally;Pupils equal, round, reactive to light and accommodation III,IV,VI: doll's response absent bilaterally.  V,VII: corneal reflexpresentbilaterally VIII: patient does not respond to verbal stimuli IX,X: gag reflexunable to be tested, XI: trapezius strength unable to test bilaterally XII: tongue strength unable to test Motor: Moves extremities spontaneously.  Lab Results: Basic Metabolic Panel: Recent Labs  Lab 11/16/17 1657 11/17/17 0506 11/18/17 0630  NA 140 143 139  K 3.9 4.2 3.7  CL 108 112* 108  CO2 23 22 23   GLUCOSE 91 92 317*  BUN 32* 29* 21  CREATININE 1.56* 1.45* 1.06*  CALCIUM 8.9 8.7* 8.8*    Liver Function Tests: Recent Labs  Lab 11/16/17 1657 11/17/17 0506 11/18/17 0630  AST 142* 93* 162*  ALT 316* 240* 246*  ALKPHOS 158* 135* 320*  BILITOT 2.9* 2.3* 4.4*  PROT 7.6 7.0 7.0   ALBUMIN 3.1* 2.8* 2.5*   No results for input(s): LIPASE, AMYLASE in the last 168 hours. No results for input(s): AMMONIA in the last 168 hours.  CBC: Recent Labs  Lab 11/16/17 1657 11/17/17 0506 11/18/17 0630  WBC 5.2 8.7 8.6  NEUTROABS 4.3  --  7.8*  HGB 12.4 12.8 11.2*  HCT 37.1 38.9 33.5*  MCV 89.3 91.6 89.8  PLT 131* 120* 108*    Cardiac Enzymes: Recent Labs  Lab 11/16/17 2246  TROPONINI <0.03    Lipid Panel: No results for input(s): CHOL, TRIG, HDL, CHOLHDL, VLDL, LDLCALC in the last 168 hours.  CBG: Recent Labs  Lab 11/19/17 1152 11/19/17 1630 11/19/17 2050 11/20/17 0723 11/20/17 1152  GLUCAP 124* 81 83 88 91    Microbiology: Results for orders placed or performed during the hospital encounter of 11/16/17  Urine culture     Status: None   Collection Time: 11/16/17  4:48 PM  Result Value Ref Range Status   Specimen Description   Final    URINE, RANDOM Performed at [facility], [address], [place], [place] [address]    Special Requests   Final    NONE Performed at [facility], [address] [place], [place] [address]    Culture   Final    NO GROWTH Performed at [facility], [address] [place], [place] [address]    Report Status 11/17/2017 FINAL  Final  Culture, blood (Routine x 2)     Status: None (Preliminary result)   Collection Time: 11/16/17  4:57 PM  Result Value Ref Range Status   Specimen Description BLOOD LEFT FOREARM  Final   Special Requests   Final    BOTTLES DRAWN AEROBIC AND ANAEROBIC Blood Culture results may not be optimal due to an inadequate volume of blood received in culture bottles   Culture   Final    NO GROWTH 4 DAYS Performed at [facility], [address], [place], [place] [address]    Report Status PENDING  Incomplete  Culture, blood (Routine x 2)     Status: None (Preliminary result)   Collection Time: 11/16/17  5:02 PM  Result Value Ref Range Status   Specimen  Description BLOOD RIGHT FOREARM  Final   Special Requests   Final    BOTTLES DRAWN AEROBIC AND ANAEROBIC Blood Culture results may not be optimal due to an inadequate volume of blood received in culture bottles   Culture   Final    NO GROWTH 4 DAYS Performed at [facility], [address] [place], [place] [address]    Report Status PENDING  Incomplete  Respiratory Panel by PCR     Status: None   Collection Time: 11/17/17  8:41 AM  Result Value Ref Range Status   Adenovirus NOT DETECTED NOT DETECTED Final   Coronavirus 229E NOT DETECTED NOT DETECTED Final   Coronavirus HKU1 NOT DETECTED NOT DETECTED Final   Coronavirus NL63 NOT DETECTED NOT DETECTED Final   Coronavirus OC43 NOT DETECTED NOT DETECTED Final   Metapneumovirus NOT DETECTED NOT DETECTED Final   Rhinovirus / Enterovirus NOT DETECTED NOT DETECTED Final   Influenza A NOT DETECTED NOT DETECTED Final   Influenza B NOT DETECTED NOT DETECTED Final   Parainfluenza Virus 1 NOT DETECTED NOT DETECTED Final   Parainfluenza Virus 2 NOT DETECTED NOT DETECTED Final   Parainfluenza Virus 3 NOT DETECTED NOT DETECTED Final   Parainfluenza Virus 4 NOT DETECTED NOT DETECTED Final   Respiratory Syncytial Virus NOT DETECTED NOT DETECTED Final   Bordetella pertussis NOT DETECTED NOT DETECTED Final   Chlamydophila pneumoniae NOT DETECTED NOT DETECTED Final   Mycoplasma pneumoniae NOT DETECTED NOT DETECTED Final    Comment: Performed at [facility], [address], [place], [place] [address]  MRSA PCR Screening     Status: None   Collection Time: 11/17/17  6:54 PM  Result Value Ref Range Status   MRSA by PCR NEGATIVE NEGATIVE Final    Comment:        The GeneXpert MRSA Assay (FDA approved for NASAL specimens only), is one component of a comprehensive MRSA colonization surveillance program. It is not intended to diagnose MRSA infection nor to guide or monitor treatment for MRSA infections. Performed at [facility], [address] [place], [place] [address]     Coagulation Studies: No results for input(s): LABPROT, INR in the last 72 hours.  Imaging: Mr Brain Wo Contrast  Result Date: 11/18/2017 CLINICAL DATA:  77 y/o F; found down. History of dementia and hypertension. Sepsis. EXAM: MRI HEAD WITHOUT CONTRAST TECHNIQUE: Multiplanar, multiecho pulse sequences of the brain and surrounding structures were obtained without intravenous contrast. COMPARISON:  11/17/2017 CT head.  10/09/2015 MRI head. FINDINGS: Brain: Extensive motion degradation on multiple sequences. No reduced diffusion to suggest acute or  early subacute infarction. Stable nonspecific foci of T2 FLAIR hyperintense signal abnormality in subcortical and periventricular white matter are compatible with mild chronic microvascular ischemic changes for age. Mild brain parenchymal volume loss. No focal mass effect, extra-axial collection, hydrocephalus, or herniation. Punctate stable focus of susceptibility hypointensity in left cerebellar hemisphere compatible with chronic hemosiderin deposition from microhemorrhage. Vascular: Normal flow voids. Skull and upper cervical spine: Normal marrow signal. Sinuses/Orbits: Right maxillary sinus mucous retention cyst. No significant abnormal signal of mastoid air cells. Orbits are unremarkable. Other: None. IMPRESSION: 1. Motion degraded study. 2. No acute intracranial abnormality identified. 3. Stable mild chronic microvascular ischemic changes and parenchymal volume loss of the brain. Electronically Signed   By: Mitzi HansenLance  Furusawa-Stratton M.D.   On: 11/18/2017 14:33    Medications:  I have reviewed the patient's current medications. Scheduled: . aspirin EC  81 mg Oral Daily  . enoxaparin (LOVENOX) injection  40 mg Subcutaneous BID  . feeding supplement (ENSURE ENLIVE)  237 mL Oral BID BM  . insulin aspart  0-5 Units Subcutaneous QHS  . insulin aspart  0-9 Units Subcutaneous TID WC  . insulin aspart  3  Units Subcutaneous TID WC  . ipratropium-albuterol  3 mL Nebulization Q6H  . memantine  10 mg Oral BID  . modafinil  200 mg Oral Daily  . multivitamin with minerals   Oral Daily  . nystatin  5 mL Mouth/Throat QID    Assessment/Plan: Patient remains with eyes closed and not following commands or really interacting.  Patient also today with productive cough.  Family reports that at home would have spells of labored breathing which has been witnessed here as well.     Recommendations: 1. Would consider further work up for possible aspiration PNA 2. D/C Keppra 3. Continue Depakote at current dose.  Depakote level in AM   LOS: 4 days   Thana FarrLeslie Simone Tuckey, MD Neurology [phone] 11/20/2017  12:39 PM

## 2017-11-20 NOTE — Progress Notes (Addendum)
Speech Language Pathology Treatment: Dysphagia  Patient Details Name: Sheena Mitchell MRN: [id number] DOB: [birth year] Today's Date: 11/20/2017 Time: 1105-1140 SLP Time Calculation (min) (ACUTE ONLY): 35 min  Assessment / Plan / Recommendation Clinical Impression  Pt seen for ongoing assessment of toleration of diet and safety w/ oral intake. Pt had a new onset of seizures and was transferred to CCU for ~2 days; now transferred out of CCU to regular floor. Son present in room. Noted at rest, pt appeared to have increased respiratory effort during exhalation w/ slight open-mouth posture and lingual forward position.  Pt again presented w/ eyes closed the majority of session but responded to tactile stimulation of utensil or straw at lips. She consumed ~3-4 trials each of puree and thin liquids(using pinched straw at times) w/ no immediate, overt s/s of aspiration though a delayed cough x1 was noted b/t trials w/ trial of magic cup(increased phlegm). Pt was given more verbal/tactile cues to swallow and clear orally b/t trials. Suspect pt's oropharyngeal phases of swallowing are impacted by her declined Cognitive status in light of further illness. Noted continued increased respiratory effort w/ the exertion of po tasks - this can increase risk for aspiration as well as reduced oral intake to meet nutritional needs. Pt requires full assistance w/ feeding.  Pt is at increased risk for aspiration at this time d/t declined medical and Cognitive status'. Recommend continue w/ current diet w/ strict aspiration precautions and HOLD on any po's if pt is not fully alert/awake and if respiratory status is not calm; hold po's if any increased s/s of aspiration noted and inform MD. ST services will continue to follow w/ pt's status w/ toleration of an oral diet; education on dysphagia; recommendations. NSG updated; stated she held po meds this morning d/t drowsiness. Recommend a Palliative care consult for goals of  care w/ family; education.    HPI HPI: Pt is a 77 y.o. female with a known history of Dementia and hypertension- daughter was her care taker at home.  For last few days daughter had some flulike symptoms as per patient's family members in the room.  Patient also had diarrhea and progressive weakness for last 3 to 4 days.  She was taken to her [facility] yesterday and she had a home visiting nurse. Nurse went at home today and nobody opened the door.  So the nurse called police, they entered the house and found patient's daughter dead, and patient laying face down with unresponsive. EMS was called in and they noted patient running high-grade fever and they brought to the emergency room. Initially, pt has been minimally responsive to stimuli but opens eyes; not good verbal communication. Initial work-up for infection is negative. Head CT showed no acute issues. Pt is more awake/alert today to participate in taking po trials during BSE. She verbalized 2-3 single words w/ Brother present in room. He denied pt having any swallowing problems prior to this admission. Pt has had a new onset of Seizures w/ transfer to CCU x2 days. Pt has now moved back onto a regular floor; eyes closed but w/ few verbal responses. Noted increased Respiratory effort at baseline w/ open-mouth posture and tongue forward position.       SLP Plan  Continue with current plan of care       Recommendations  Diet recommendations: Dysphagia 1 (puree);Thin liquid Liquids provided via: Cup;Straw Medication Administration: Crushed with puree(for safer swallowing) Supervision: Staff to assist with self feeding;Full supervision/cueing for compensatory strategies(holding  any po's when indicated) Compensations: Minimize environmental distractions;Slow rate;Small sips/bites;Lingual sweep for clearance of pocketing;Multiple dry swallows after each bite/sip;Follow solids with liquid Postural Changes and/or Swallow Maneuvers: Seated upright 90  degrees;Upright 30-60 min after meal                General recommendations: (Palliative Care consult; Dietician) Oral Care Recommendations: Oral care BID;Staff/trained caregiver to provide oral care Follow up Recommendations: (TBD) SLP Visit Diagnosis: Dysphagia, oropharyngeal phase (R13.12) Plan: Continue with current plan of care       GO                Sheena SomKatherine [name], MS, CCC-SLP Mitchell,[name] 11/20/2017, 11:45 AM

## 2017-11-20 NOTE — Progress Notes (Signed)
[employer] [facility] - [place] [facility]                                                                                                                                                                                  Patient Demographics   Sheena Mitchell, is a 77 y.o. female, DOB - 1940-11-05, [id number]  Admit date - 11/16/2017   Admitting Physician Altamese DillingVaibhavkumar Vachhani, MD  Outpatient [facility] the [facility]   LOS - 4  Subjective: Patient remains drowsy.  Opens eyes when stimulated  Review of Systems:   CONSTITUTIONAL: Sleepy Vitals:   Vitals:   11/19/17 1814 11/19/17 1938 11/20/17 0429 11/20/17 0844  BP:  (!) 143/67 (!) 156/86 (!) 169/98  Pulse:  84 74 72  Resp:  18 18 20   Temp:  98.3 F (36.8 C) (!) 97 F (36.1 C)   TempSrc:  Axillary Axillary   SpO2:  97% 99% 99%  Weight: 103.5 kg (228 lb 2 oz)     Height: 5\' 5"  (1.651 m)       Wt Readings from Last 3 Encounters:  11/19/17 103.5 kg (228 lb 2 oz)  10/09/15 99.8 kg (220 lb)  10/02/15 99.8 kg (220 lb)     Intake/Output Summary (Last 24 hours) at 11/20/2017 1203 Last data filed at 11/20/2017 0533 Gross per 24 hour  Intake 1588.5 ml  Output 300 ml  Net 1288.5 ml    Physical Exam:   GENERAL: Critically ill  HEAD, EYES, EARS, NOSE AND THROAT: Atraumatic, normocephalic. . Pupils equal and reactive to light. Sclerae anicteric. No conjunctival injection. No oro-pharyngeal erythema.  NECK: Supple. There is no jugular venous distention. No bruits, no lymphadenopathy, no thyromegaly.  HEART: Regular rate and rhythm,. No murmurs, no rubs, no clicks.  LUNGS: Clear to auscultation bilaterally. No rales or rhonchi. No wheezes.  ABDOMEN: Soft, flat, nontender, nondistended. Has good bowel sounds. No hepatosplenomegaly appreciated.  EXTREMITIES: No evidence of any cyanosis, clubbing, or peripheral edema.  +2 pedal and radial pulses bilaterally.  NEUROLOGIC: Poorly  responsive SKIN: Moist and warm with no rashes appreciated.  Psych: Not anxious, depressed LN: No inguinal LN enlargement    Antibiotics   Anti-infectives (From admission, onward)   Start     Dose/Rate Route Frequency Ordered Stop   11/19/17 1000  doxycycline (VIBRAMYCIN) 100 mg in sodium chloride 0.9 % 250 mL IVPB     100 mg 125 mL/hr over 120 Minutes Intravenous Every 12 hours 11/19/17 0850     11/18/17 1600  Ampicillin-Sulbactam (UNASYN) 3 g in sodium chloride 0.9 % 100 mL IVPB  Status:  Discontinued  3 g 200 mL/hr over 30 Minutes Intravenous Every 6 hours 11/18/17 1552 11/19/17 0850   11/17/17 2200  vancomycin (VANCOCIN) IVPB 1000 mg/200 mL premix  Status:  Discontinued     1,000 mg 200 mL/hr over 60 Minutes Intravenous Every 18 hours 11/17/17 2002 11/17/17 2106   11/17/17 1100  piperacillin-tazobactam (ZOSYN) IVPB 3.375 g  Status:  Discontinued     3.375 g 12.5 mL/hr over 240 Minutes Intravenous Every 8 hours 11/17/17 0832 11/18/17 1046   11/17/17 1000  vancomycin (VANCOCIN) 1,250 mg in sodium chloride 0.9 % 250 mL IVPB  Status:  Discontinued     1,250 mg 166.7 mL/hr over 90 Minutes Intravenous Every 24 hours 11/17/17 0832 11/17/17 1432   11/16/17 1715  piperacillin-tazobactam (ZOSYN) IVPB 3.375 g     3.375 g 100 mL/hr over 30 Minutes Intravenous  Once 11/16/17 1701 11/16/17 1750   11/16/17 1715  vancomycin (VANCOCIN) IVPB 1000 mg/200 mL premix     1,000 mg 200 mL/hr over 60 Minutes Intravenous  Once 11/16/17 1701 11/16/17 1900      Medications   Scheduled Meds: . aspirin EC  81 mg Oral Daily  . enoxaparin (LOVENOX) injection  40 mg Subcutaneous BID  . feeding supplement (ENSURE ENLIVE)  237 mL Oral BID BM  . insulin aspart  0-5 Units Subcutaneous QHS  . insulin aspart  0-9 Units Subcutaneous TID WC  . insulin aspart  3 Units Subcutaneous TID WC  . memantine  10 mg Oral BID  . modafinil  200 mg Oral Daily  . multivitamin with minerals   Oral Daily  . nystatin  5  mL Mouth/Throat QID   Continuous Infusions: . doxycycline (VIBRAMYCIN) IV 100 mg (11/20/17 0954)  . lactated ringers 50 mL/hr at 11/19/17 1123  . levETIRAcetam Stopped (11/20/17 0542)  . valproate sodium Stopped (11/20/17 1610)   PRN Meds:.acetaminophen, hydrALAZINE, ipratropium-albuterol   Data Review:   Micro Results Recent Results (from the past 240 hour(s))  Urine culture     Status: None   Collection Time: 11/16/17  4:48 PM  Result Value Ref Range Status   Specimen Description   Final    URINE, RANDOM Performed at [facility], [address] [place], [place] [address]    Special Requests   Final    NONE Performed at [facility], [address] [place], [place] [address]    Culture   Final    NO GROWTH Performed at [facility], [address] [place], [place] [address]    Report Status 11/17/2017 FINAL  Final  Culture, blood (Routine x 2)     Status: None (Preliminary result)   Collection Time: 11/16/17  4:57 PM  Result Value Ref Range Status   Specimen Description BLOOD LEFT FOREARM  Final   Special Requests   Final    BOTTLES DRAWN AEROBIC AND ANAEROBIC Blood Culture results may not be optimal due to an inadequate volume of blood received in culture bottles   Culture   Final    NO GROWTH 4 DAYS Performed at [facility], [address], [place], [place] [address]    Report Status PENDING  Incomplete  Culture, blood (Routine x 2)     Status: None (Preliminary result)   Collection Time: 11/16/17  5:02 PM  Result Value Ref Range Status   Specimen Description BLOOD RIGHT FOREARM  Final   Special Requests   Final    BOTTLES DRAWN AEROBIC AND ANAEROBIC Blood  Culture results may not be optimal due to an inadequate volume of blood received in culture bottles   Culture   Final    NO GROWTH 4 DAYS Performed at [facility], [address] [place], [place] [address]    Report Status PENDING  Incomplete  Respiratory  Panel by PCR     Status: None   Collection Time: 11/17/17  8:41 AM  Result Value Ref Range Status   Adenovirus NOT DETECTED NOT DETECTED Final   Coronavirus 229E NOT DETECTED NOT DETECTED Final   Coronavirus HKU1 NOT DETECTED NOT DETECTED Final   Coronavirus NL63 NOT DETECTED NOT DETECTED Final   Coronavirus OC43 NOT DETECTED NOT DETECTED Final   Metapneumovirus NOT DETECTED NOT DETECTED Final   Rhinovirus / Enterovirus NOT DETECTED NOT DETECTED Final   Influenza A NOT DETECTED NOT DETECTED Final   Influenza B NOT DETECTED NOT DETECTED Final   Parainfluenza Virus 1 NOT DETECTED NOT DETECTED Final   Parainfluenza Virus 2 NOT DETECTED NOT DETECTED Final   Parainfluenza Virus 3 NOT DETECTED NOT DETECTED Final   Parainfluenza Virus 4 NOT DETECTED NOT DETECTED Final   Respiratory Syncytial Virus NOT DETECTED NOT DETECTED Final   Bordetella pertussis NOT DETECTED NOT DETECTED Final   Chlamydophila pneumoniae NOT DETECTED NOT DETECTED Final   Mycoplasma pneumoniae NOT DETECTED NOT DETECTED Final    Comment: Performed at [facility], [address], [place], [place] [address]  MRSA PCR Screening     Status: None   Collection Time: 11/17/17  6:54 PM  Result Value Ref Range Status   MRSA by PCR NEGATIVE NEGATIVE Final    Comment:        The GeneXpert MRSA Assay (FDA approved for NASAL specimens only), is one component of a comprehensive MRSA colonization surveillance program. It is not intended to diagnose MRSA infection nor to guide or monitor treatment for MRSA infections. Performed at [facility], [address] [place], [place] [address]     Radiology Reports Ct Head Wo Contrast  Result Date: 11/17/2017 CLINICAL DATA:  77 y/o F; acute mental status change. Fever and suspected seizure. EXAM: CT HEAD WITHOUT CONTRAST TECHNIQUE: Contiguous axial images were obtained from the base of the skull through the vertex without intravenous contrast. COMPARISON:  11/16/2017  CT head FINDINGS: Brain: No evidence of acute infarction, hemorrhage, hydrocephalus, extra-axial collection or mass lesion/mass effect. Stable chronic microvascular ischemic changes and parenchymal volume loss of the brain. Vascular: Calcific atherosclerosis of carotid siphons. No hyperdense vessel. Skull: Normal. Negative for fracture or focal lesion. Sinuses/Orbits: No acute finding. Other: None. IMPRESSION: 1. No acute intracranial abnormality identified. 2. Stable chronic microvascular ischemic changes and parenchymal volume loss of the brain. Electronically Signed   By: Mitzi Hansen M.D.   On: 11/17/2017 18:41   Ct Head Wo Contrast  Result Date: 11/16/2017 CLINICAL DATA:  Dementia. Altered mental status and progressive weakness. EXAM: CT HEAD WITHOUT CONTRAST TECHNIQUE: Contiguous axial images were obtained from the base of the skull through the vertex without intravenous contrast. COMPARISON:  08/05/2017 FINDINGS: Brain: Mild age related involutional changes of the brain with chronic minimal small vessel ischemia of periventricular and subcortical white matter. No acute intracranial hemorrhage, mass or midline shift. No large vascular territory infarct. No extra-axial fluid collections. Midline fourth ventricle and basal cisterns without effacement. The brainstem and cerebellum are stable. Calcifications along the tentorium are stable. Vascular: No hyperdense vessel sign. Skull: Intact Sinuses/Orbits: Nonacute Other: None IMPRESSION:  Minimal chronic small vessel ischemic disease. No acute intracranial abnormality. Electronically Signed   By: Tollie Eth M.D.   On: 11/16/2017 21:12   Mr Brain Wo Contrast  Result Date: 11/18/2017 CLINICAL DATA:  77 y/o F; found down. History of dementia and hypertension. Sepsis. EXAM: MRI HEAD WITHOUT CONTRAST TECHNIQUE: Multiplanar, multiecho pulse sequences of the brain and surrounding structures were obtained without intravenous contrast. COMPARISON:   11/17/2017 CT head.  10/09/2015 MRI head. FINDINGS: Brain: Extensive motion degradation on multiple sequences. No reduced diffusion to suggest acute or early subacute infarction. Stable nonspecific foci of T2 FLAIR hyperintense signal abnormality in subcortical and periventricular white matter are compatible with mild chronic microvascular ischemic changes for age. Mild brain parenchymal volume loss. No focal mass effect, extra-axial collection, hydrocephalus, or herniation. Punctate stable focus of susceptibility hypointensity in left cerebellar hemisphere compatible with chronic hemosiderin deposition from microhemorrhage. Vascular: Normal flow voids. Skull and upper cervical spine: Normal marrow signal. Sinuses/Orbits: Right maxillary sinus mucous retention cyst. No significant abnormal signal of mastoid air cells. Orbits are unremarkable. Other: None. IMPRESSION: 1. Motion degraded study. 2. No acute intracranial abnormality identified. 3. Stable mild chronic microvascular ischemic changes and parenchymal volume loss of the brain. Electronically Signed   By: Mitzi Hansen M.D.   On: 11/18/2017 14:33   Dg Chest Port 1 View  Result Date: 11/17/2017 CLINICAL DATA:  Fever, unresponsive. EXAM: PORTABLE CHEST 1 VIEW COMPARISON:  Radiograph of November 16, 2017. FINDINGS: The heart size and mediastinal contours are within normal limits. Atherosclerosis of thoracic aorta is noted. Hypoinflation of the lungs is noted with minimal bibasilar subsegmental atelectasis. The visualized skeletal structures are unremarkable. IMPRESSION: Hypoinflation of lungs with minimal bibasilar subsegmental atelectasis. Aortic Atherosclerosis (ICD10-I70.0). Electronically Signed   By: Lupita Raider, M.D.   On: 11/17/2017 18:07   Dg Chest Port 1 View  Result Date: 11/16/2017 CLINICAL DATA:  77 y/o F; altered mental status. Abnormal breath sounds. Sepsis. EXAM: PORTABLE CHEST 1 VIEW COMPARISON:  None. FINDINGS: Normal cardiac  silhouette. Aortic atherosclerosis with calcification. Mild bronchitic changes. No focal consolidation no pleural effusion or pneumothorax. No acute osseous abnormality is evident. IMPRESSION: Mild bronchitic changes. No focal consolidation. Aortic atherosclerosis. Electronically Signed   By: Mitzi Hansen M.D.   On: 11/16/2017 17:27   US Abdomen Limited Ruq  Result Date: 11/16/2017 CLINICAL DATA:  Elevated liver function test with sepsis. EXAM: ULTRASOUND ABDOMEN LIMITED RIGHT UPPER QUADRANT COMPARISON:  None. FINDINGS: Gallbladder: The gallbladder is physiologically distended without focal mural thickening. Gallbladder wall is 2.2 mm in single wall thickness which is within normal limits. There is layering biliary sludge with a mobile 1.9 cm gallstone noted near the neck. No sonographic Murphy sign noted by sonographer. Common bile duct: Diameter: Ranged in size from 6.2 mm approximately the 10.4 mm distally. No choledocholithiasis. Liver: No focal lesion identified. Within normal limits in parenchymal echogenicity. Portal vein is patent on color Doppler imaging with normal direction of blood flow towards the liver. IMPRESSION: Uncomplicated cholelithiasis with biliary sludge. Electronically Signed   By: Tollie Eth M.D.   On: 11/16/2017 19:25     CBC Recent Labs  Lab 11/16/17 1657 11/17/17 0506 11/18/17 0630  WBC 5.2 8.7 8.6  HGB 12.4 12.8 11.2*  HCT 37.1 38.9 33.5*  PLT 131* 120* 108*  MCV 89.3 91.6 89.8  MCH 30.0 30.1 30.1  MCHC 33.6 32.9 33.6  RDW 15.2* 15.5* 15.7*  LYMPHSABS 0.5*  --  0.3*  MONOABS 0.4  --  0.5  EOSABS 0.0  --  0.0  BASOSABS 0.0  --  0.0    Chemistries  Recent Labs  Lab 11/16/17 1657 11/17/17 0506 11/18/17 0630  NA 140 143 139  K 3.9 4.2 3.7  CL 108 112* 108  CO2 23 22 23   GLUCOSE 91 92 317*  BUN 32* 29* 21  CREATININE 1.56* 1.45* 1.06*  CALCIUM 8.9 8.7* 8.8*  AST 142* 93* 162*  ALT 316* 240* 246*  ALKPHOS 158* 135* 320*  BILITOT 2.9* 2.3*  4.4*   ------------------------------------------------------------------------------------------------------------------ estimated creatinine clearance is 55.6 mL/min (A) (by C-G formula based on SCr of 1.06 mg/dL (H)). ------------------------------------------------------------------------------------------------------------------ Recent Labs    11/18/17 0630  HGBA1C 6.0*   ------------------------------------------------------------------------------------------------------------------ No results for input(s): CHOL, HDL, LDLCALC, TRIG, CHOLHDL, LDLDIRECT in the last 72 hours. ------------------------------------------------------------------------------------------------------------------ No results for input(s): TSH, T4TOTAL, T3FREE, THYROIDAB in the last 72 hours.  Invalid input(s): FREET3 ------------------------------------------------------------------------------------------------------------------ No results for input(s): VITAMINB12, FOLATE, FERRITIN, TIBC, IRON, RETICCTPCT in the last 72 hours.  Coagulation profile Recent Labs  Lab 11/16/17 2246  INR 1.14    No results for input(s): DDIMER in the last 72 hours.  Cardiac Enzymes Recent Labs  Lab 11/16/17 2246  TROPONINI <0.03   ------------------------------------------------------------------------------------------------------------------ Invalid input(s): POCBNP    Assessment & Plan   *Sepsis Fever of unknown origin cultures negative   continue IV doxycycline    *Altered mental status likely metabolic encephalopathy  MRI without stroke EEG negative I will add provigil  *Seizure continue IV Keppra being transitioned to VPA EEG negative and neurology consult appreciated   *Dementia Continue baseline home medicines when able to take po  *Acute on chronic renal insufficiency resolved  *Elevated LFT Hepatitis panel negative Ultrasound shows no liver abnormality  *Essential hypertension not on  any medications continue monitor  *Hyperlipidemia cholesterol medications discontinued  Prognosis very poor      Code Status Orders  (From admission, onward)        Start     Ordered   11/16/17 2220  Do not attempt resuscitation (DNR)  Continuous    Question Answer Comment  In the event of cardiac or respiratory ARREST Do not call a "code blue"   In the event of cardiac or respiratory ARREST Do not perform Intubation, CPR, defibrillation or ACLS   In the event of cardiac or respiratory ARREST Use medication by any route, position, wound care, and other measures to relive pain and suffering. May use oxygen, suction and manual treatment of airway obstruction as needed for comfort.      11/16/17 2219    Code Status History    This patient has a current code status but no historical code status.           Consults none  DVT Prophylaxis  Lovenox   Lab Results  Component Value Date   PLT 108 (L) 11/18/2017     Time Spent in minutes   35 minutes greater than 50% of time spent in care coordination and counseling patient regarding the condition and plan of care.   Auburn Bilberry M.D on 11/20/2017 at 12:03 PM  Between 7am to 6pm - Pager - [phone]  After 6pm go to [email or web address] - Social research officer, government  [employer]   Office  [phone]

## 2017-11-20 NOTE — Progress Notes (Signed)
Chaplain was referred by secretary of ER because daughter of Pt was found deceased when Pt was brought to ER. Brother was at the bedside. Chaplain offered prayers and brother accepted. Pt was not alert. Will follow up.    11/20/17 1400  Clinical Encounter Type  Visited With Patient and family together  Visit Type Follow-up  Spiritual Encounters  Spiritual Needs Prayer

## 2017-11-20 NOTE — Progress Notes (Signed)
Pt very hard to arouse. Tried several times over the course of the day to wake pt, but she will moan, and sometimes eyes will flutter, but she will not stay awake. MD is aware. No oral medications given due to pt condition. Will continue to monitor.

## 2017-11-21 DIAGNOSIS — R410 Disorientation, unspecified: Secondary | ICD-10-CM

## 2017-11-21 LAB — BASIC METABOLIC PANEL
ANION GAP: 8 (ref 5–15)
BUN: 26 mg/dL — ABNORMAL HIGH (ref 8–23)
CO2: 24 mmol/L (ref 22–32)
Calcium: 8.9 mg/dL (ref 8.9–10.3)
Chloride: 113 mmol/L — ABNORMAL HIGH (ref 98–111)
Creatinine, Ser: 1.05 mg/dL — ABNORMAL HIGH (ref 0.44–1.00)
GFR, EST AFRICAN AMERICAN: 59 mL/min — AB (ref >60)
GFR, EST NON AFRICAN AMERICAN: 51 mL/min — AB (ref >60)
GLUCOSE: 115 mg/dL — AB (ref 70–99)
Potassium: 3.4 mmol/L — ABNORMAL LOW (ref 3.5–5.1)
Sodium: 145 mmol/L (ref 135–145)

## 2017-11-21 LAB — GLUCOSE, CAPILLARY
GLUCOSE-CAPILLARY: 117 mg/dL — AB (ref 70–99)
GLUCOSE-CAPILLARY: 164 mg/dL — AB (ref 70–99)
Glucose-Capillary: 97 mg/dL (ref 70–99)
Glucose-Capillary: 163 mg/dL — ABNORMAL HIGH (ref 70–99)

## 2017-11-21 LAB — HEPATIC FUNCTION PANEL
ALBUMIN: 2.6 g/dL — AB (ref 3.5–5.0)
ALK PHOS: 204 U/L — AB (ref 38–126)
ALT: 201 U/L — ABNORMAL HIGH (ref 0–44)
AST: 126 U/L — AB (ref 15–41)
Bilirubin, Direct: 0.6 mg/dL — ABNORMAL HIGH (ref 0.0–0.2)
Indirect Bilirubin: 1.2 mg/dL — ABNORMAL HIGH (ref 0.3–0.9)
TOTAL PROTEIN: 6.7 g/dL (ref 6.5–8.1)
Total Bilirubin: 1.8 mg/dL — ABNORMAL HIGH (ref 0.3–1.2)

## 2017-11-21 LAB — CULTURE, BLOOD (ROUTINE X 2)
Culture: NO GROWTH
Culture: NO GROWTH

## 2017-11-21 LAB — AMMONIA: Ammonia: 18 umol/L (ref 9–35)

## 2017-11-21 LAB — VALPROIC ACID LEVEL: Valproic Acid Lvl: 19 ug/mL — ABNORMAL LOW (ref 50.0–100.0)

## 2017-11-21 MED ORDER — VALPROATE SODIUM 500 MG/5ML IV SOLN
1000.0000 mg | Freq: Two times a day (BID) | INTRAVENOUS | Status: DC
  Administered 2017-11-21 – 2017-11-26 (×10): 1000 mg via INTRAVENOUS
  Filled 2017-11-21 (×12): qty 10

## 2017-11-21 MED ORDER — POTASSIUM CL IN DEXTROSE 5% 20 MEQ/L IV SOLN
20.0000 meq | INTRAVENOUS | Status: DC
  Administered 2017-11-21 – 2017-11-23 (×5): 20 meq via INTRAVENOUS
  Filled 2017-11-21 (×7): qty 1000

## 2017-11-21 MED ORDER — IPRATROPIUM-ALBUTEROL 0.5-2.5 (3) MG/3ML IN SOLN
3.0000 mL | Freq: Four times a day (QID) | RESPIRATORY_TRACT | Status: DC | PRN
  Administered 2017-11-21: 06:00:00 3 mL via RESPIRATORY_TRACT
  Filled 2017-11-21: qty 3

## 2017-11-21 MED ORDER — VALPROATE SODIUM 500 MG/5ML IV SOLN
15.0000 mg/kg/d | Freq: Two times a day (BID) | INTRAVENOUS | Status: DC
  Filled 2017-11-21 (×2): qty 7.76

## 2017-11-21 MED ORDER — METHYLPREDNISOLONE SODIUM SUCC 125 MG IJ SOLR
60.0000 mg | Freq: Four times a day (QID) | INTRAMUSCULAR | Status: DC
  Administered 2017-11-21 – 2017-11-24 (×13): 60 mg via INTRAVENOUS
  Filled 2017-11-21 (×13): qty 2

## 2017-11-21 MED ORDER — IPRATROPIUM-ALBUTEROL 0.5-2.5 (3) MG/3ML IN SOLN
3.0000 mL | Freq: Two times a day (BID) | RESPIRATORY_TRACT | Status: DC
  Administered 2017-11-21 – 2017-11-24 (×8): 3 mL via RESPIRATORY_TRACT
  Filled 2017-11-21 (×8): qty 3

## 2017-11-21 NOTE — Progress Notes (Signed)
[employer] -  at [facility]                                                                                                                                                                                  Patient Demographics   Sheena Mitchell, is a 77 y.o. female, DOB - 07-08-1940, ZOX:[id number]  Admit date - 11/16/2017   Admitting Physician Altamese Dilling, MD  Outpatient Primary MD for the patient is Center, [facility]   LOS - 5  Subjective: Patient remains drowsy.  Opens eyes when stimulated Patient's chest x-ray without pneumonia  Review of Systems:   CONSTITUTIONAL: Sleepy Vitals:   Vitals:   11/20/17 1954 11/20/17 2016 11/21/17 0517 11/21/17 0620  BP:  (!) 148/74 (!) 173/86 127/62  Pulse:  88 80 90  Resp:  (!) 22 (!) 22   Temp:  98.7 F (37.1 C) 98.2 F (36.8 C)   TempSrc:  Oral Oral   SpO2: 95% 90% 98%   Weight:      Height:        Wt Readings from Last 3 Encounters:  11/19/17 103.5 kg (228 lb 2 oz)  10/09/15 99.8 kg (220 lb)  10/02/15 99.8 kg (220 lb)     Intake/Output Summary (Last 24 hours) at 11/21/2017 1205 Last data filed at 11/21/2017 0517 Gross per 24 hour  Intake 1879.33 ml  Output 1200 ml  Net 679.33 ml    Physical Exam:   GENERAL: Critically ill  HEAD, EYES, EARS, NOSE AND THROAT: Atraumatic, normocephalic. . Pupils equal and reactive to light. Sclerae anicteric. No conjunctival injection. No oro-pharyngeal erythema.  NECK: Supple. There is no jugular venous distention. No bruits, no lymphadenopathy, no thyromegaly.  HEART: Regular rate and rhythm,. No murmurs, no rubs, no clicks.  LUNGS: Bilateral wheezing throughout both lung no accessory muscle usage ABDOMEN: Soft, flat, nontender, nondistended. Has good bowel sounds. No hepatosplenomegaly appreciated.  EXTREMITIES: No evidence of any cyanosis, clubbing, or peripheral edema.  +2 pedal and radial pulses bilaterally.  NEUROLOGIC: Poorly  responsive SKIN: Moist and warm with no rashes appreciated.  Psych: Not anxious, depressed LN: No inguinal LN enlargement    Antibiotics   Anti-infectives (From admission, onward)   Start     Dose/Rate Route Frequency Ordered Stop   11/19/17 1000  doxycycline (VIBRAMYCIN) 100 mg in sodium chloride 0.9 % 250 mL IVPB     100 mg 125 mL/hr over 120 Minutes Intravenous Every 12 hours 11/19/17 0850     11/18/17 1600  Ampicillin-Sulbactam (UNASYN) 3 g in sodium chloride 0.9 % 100 mL IVPB  Status:  Discontinued     3 g 200 mL/hr over  30 Minutes Intravenous Every 6 hours 11/18/17 1552 11/19/17 0850   11/17/17 2200  vancomycin (VANCOCIN) IVPB 1000 mg/200 mL premix  Status:  Discontinued     1,000 mg 200 mL/hr over 60 Minutes Intravenous Every 18 hours 11/17/17 2002 11/17/17 2106   11/17/17 1100  piperacillin-tazobactam (ZOSYN) IVPB 3.375 g  Status:  Discontinued     3.375 g 12.5 mL/hr over 240 Minutes Intravenous Every 8 hours 11/17/17 0832 11/18/17 1046   11/17/17 1000  vancomycin (VANCOCIN) 1,250 mg in sodium chloride 0.9 % 250 mL IVPB  Status:  Discontinued     1,250 mg 166.7 mL/hr over 90 Minutes Intravenous Every 24 hours 11/17/17 0832 11/17/17 1432   11/16/17 1715  piperacillin-tazobactam (ZOSYN) IVPB 3.375 g     3.375 g 100 mL/hr over 30 Minutes Intravenous  Once 11/16/17 1701 11/16/17 1750   11/16/17 1715  vancomycin (VANCOCIN) IVPB 1000 mg/200 mL premix     1,000 mg 200 mL/hr over 60 Minutes Intravenous  Once 11/16/17 1701 11/16/17 1900      Medications   Scheduled Meds: . aspirin EC  81 mg Oral Daily  . enoxaparin (LOVENOX) injection  40 mg Subcutaneous BID  . feeding supplement (ENSURE ENLIVE)  237 mL Oral BID BM  . insulin aspart  0-5 Units Subcutaneous QHS  . insulin aspart  0-9 Units Subcutaneous TID WC  . insulin aspart  3 Units Subcutaneous TID WC  . ipratropium-albuterol  3 mL Nebulization BID  . memantine  10 mg Oral BID  . methylPREDNISolone (SOLU-MEDROL)  injection  60 mg Intravenous Q6H  . modafinil  200 mg Oral Daily  . multivitamin with minerals   Oral Daily  . nystatin  5 mL Mouth/Throat QID  . scopolamine  1 patch Transdermal Q72H   Continuous Infusions: . dextrose 5 % with KCl 20 mEq / L 20 mEq (11/21/17 1024)  . doxycycline (VIBRAMYCIN) IV 100 mg (11/21/17 1024)  . lactated ringers 50 mL/hr at 11/19/17 1123  . valproate sodium     PRN Meds:.acetaminophen, hydrALAZINE, ipratropium-albuterol   Data Review:   Micro Results Recent Results (from the past 240 hour(s))  Urine culture     Status: None   Collection Time: 11/16/17  4:48 PM  Result Value Ref Range Status   Specimen Description   Final    URINE, RANDOM Performed at [facility], [address] [place], [place] [address]    Special Requests   Final    NONE Performed at [facility], [address] [place], [place] [address]    Culture   Final    NO GROWTH Performed at [facility], [address] [place], [place] [address]    Report Status 11/17/2017 FINAL  Final  Culture, blood (Routine x 2)     Status: None   Collection Time: 11/16/17  4:57 PM  Result Value Ref Range Status   Specimen Description BLOOD LEFT FOREARM  Final   Special Requests   Final    BOTTLES DRAWN AEROBIC AND ANAEROBIC Blood Culture results may not be optimal due to an inadequate volume of blood received in culture bottles   Culture   Final    NO GROWTH 5 DAYS Performed at [facility], [address] [place], [place] [address]    Report Status 11/21/2017 FINAL  Final  Culture, blood (Routine x 2)     Status: None   Collection Time: 11/16/17  5:02 PM  Result Value Ref Range  Status   Specimen Description BLOOD RIGHT FOREARM  Final   Special Requests   Final    BOTTLES DRAWN AEROBIC AND ANAEROBIC Blood Culture results may not be optimal due to an inadequate volume of blood received in culture bottles   Culture   Final    NO GROWTH 5  DAYS Performed at [facility], [address] [place], [place] [address]    Report Status 11/21/2017 FINAL  Final  Respiratory Panel by PCR     Status: None   Collection Time: 11/17/17  8:41 AM  Result Value Ref Range Status   Adenovirus NOT DETECTED NOT DETECTED Final   Coronavirus 229E NOT DETECTED NOT DETECTED Final   Coronavirus HKU1 NOT DETECTED NOT DETECTED Final   Coronavirus NL63 NOT DETECTED NOT DETECTED Final   Coronavirus OC43 NOT DETECTED NOT DETECTED Final   Metapneumovirus NOT DETECTED NOT DETECTED Final   Rhinovirus / Enterovirus NOT DETECTED NOT DETECTED Final   Influenza A NOT DETECTED NOT DETECTED Final   Influenza B NOT DETECTED NOT DETECTED Final   Parainfluenza Virus 1 NOT DETECTED NOT DETECTED Final   Parainfluenza Virus 2 NOT DETECTED NOT DETECTED Final   Parainfluenza Virus 3 NOT DETECTED NOT DETECTED Final   Parainfluenza Virus 4 NOT DETECTED NOT DETECTED Final   Respiratory Syncytial Virus NOT DETECTED NOT DETECTED Final   Bordetella pertussis NOT DETECTED NOT DETECTED Final   Chlamydophila pneumoniae NOT DETECTED NOT DETECTED Final   Mycoplasma pneumoniae NOT DETECTED NOT DETECTED Final    Comment: Performed at [facility], [address] [place], [place] [address]  MRSA PCR Screening     Status: None   Collection Time: 11/17/17  6:54 PM  Result Value Ref Range Status   MRSA by PCR NEGATIVE NEGATIVE Final    Comment:        The GeneXpert MRSA Assay (FDA approved for NASAL specimens only), is one component of a comprehensive MRSA colonization surveillance program. It is not intended to diagnose MRSA infection nor to guide or monitor treatment for MRSA infections. Performed at [facility], [address] [place], [place] [address]     Radiology Reports Ct Head Wo Contrast  Result Date: 11/17/2017 CLINICAL DATA:  77 y/o F; acute mental status change. Fever and suspected seizure. EXAM: CT HEAD WITHOUT CONTRAST  TECHNIQUE: Contiguous axial images were obtained from the base of the skull through the vertex without intravenous contrast. COMPARISON:  11/16/2017 CT head FINDINGS: Brain: No evidence of acute infarction, hemorrhage, hydrocephalus, extra-axial collection or mass lesion/mass effect. Stable chronic microvascular ischemic changes and parenchymal volume loss of the brain. Vascular: Calcific atherosclerosis of carotid siphons. No hyperdense vessel. Skull: Normal. Negative for fracture or focal lesion. Sinuses/Orbits: No acute finding. Other: None. IMPRESSION: 1. No acute intracranial abnormality identified. 2. Stable chronic microvascular ischemic changes and parenchymal volume loss of the brain. Electronically Signed   By: Mitzi Hansen M.D.   On: 11/17/2017 18:41   Ct Head Wo Contrast  Result Date: 11/16/2017 CLINICAL DATA:  Dementia. Altered mental status and progressive weakness. EXAM: CT HEAD WITHOUT CONTRAST TECHNIQUE: Contiguous axial images were obtained from the base of the skull through the vertex without intravenous contrast. COMPARISON:  08/05/2017 FINDINGS: Brain: Mild age related involutional changes of the brain with chronic minimal small vessel ischemia of periventricular and subcortical white matter. No acute intracranial hemorrhage, mass or midline shift. No large vascular territory infarct. No extra-axial fluid collections. Midline fourth ventricle and basal  cisterns without effacement. The brainstem and cerebellum are stable. Calcifications along the tentorium are stable. Vascular: No hyperdense vessel sign. Skull: Intact Sinuses/Orbits: Nonacute Other: None IMPRESSION: Minimal chronic small vessel ischemic disease. No acute intracranial abnormality. Electronically Signed   By: Tollie Eth M.D.   On: 11/16/2017 21:12   Mr Brain Wo Contrast  Result Date: 11/18/2017 CLINICAL DATA:  77 y/o F; found down. History of dementia and hypertension. Sepsis. EXAM: MRI HEAD WITHOUT CONTRAST  TECHNIQUE: Multiplanar, multiecho pulse sequences of the brain and surrounding structures were obtained without intravenous contrast. COMPARISON:  11/17/2017 CT head.  10/09/2015 MRI head. FINDINGS: Brain: Extensive motion degradation on multiple sequences. No reduced diffusion to suggest acute or early subacute infarction. Stable nonspecific foci of T2 FLAIR hyperintense signal abnormality in subcortical and periventricular white matter are compatible with mild chronic microvascular ischemic changes for age. Mild brain parenchymal volume loss. No focal mass effect, extra-axial collection, hydrocephalus, or herniation. Punctate stable focus of susceptibility hypointensity in left cerebellar hemisphere compatible with chronic hemosiderin deposition from microhemorrhage. Vascular: Normal flow voids. Skull and upper cervical spine: Normal marrow signal. Sinuses/Orbits: Right maxillary sinus mucous retention cyst. No significant abnormal signal of mastoid air cells. Orbits are unremarkable. Other: None. IMPRESSION: 1. Motion degraded study. 2. No acute intracranial abnormality identified. 3. Stable mild chronic microvascular ischemic changes and parenchymal volume loss of the brain. Electronically Signed   By: Mitzi Hansen M.D.   On: 11/18/2017 14:33   Dg Chest Port 1 View  Result Date: 11/20/2017 CLINICAL DATA:  SOB per physician order. Pt admitted 11/16/2017 for sepsis and altered mental status likely metabolic encephalopathy. Hx - HTN, dementia, acute on chronic renal insufficiency, former smoker. EXAM: PORTABLE CHEST 1 VIEW COMPARISON:  11/17/2017 FINDINGS: Cardiac silhouette is normal in size. No mediastinal or hilar masses. No evidence of adenopathy. There are prominent bronchovascular markings accentuated by low lung volumes. No evidence of pneumonia or pulmonary edema. No pleural effusion or pneumothorax. Skeletal structures are demineralized but grossly intact IMPRESSION: No active disease.  Electronically Signed   By: Amie Portland M.D.   On: 11/20/2017 14:46   Dg Chest Port 1 View  Result Date: 11/17/2017 CLINICAL DATA:  Fever, unresponsive. EXAM: PORTABLE CHEST 1 VIEW COMPARISON:  Radiograph of November 16, 2017. FINDINGS: The heart size and mediastinal contours are within normal limits. Atherosclerosis of thoracic aorta is noted. Hypoinflation of the lungs is noted with minimal bibasilar subsegmental atelectasis. The visualized skeletal structures are unremarkable. IMPRESSION: Hypoinflation of lungs with minimal bibasilar subsegmental atelectasis. Aortic Atherosclerosis (ICD10-I70.0). Electronically Signed   By: Lupita Raider, M.D.   On: 11/17/2017 18:07   Dg Chest Port 1 View  Result Date: 11/16/2017 CLINICAL DATA:  77 y/o F; altered mental status. Abnormal breath sounds. Sepsis. EXAM: PORTABLE CHEST 1 VIEW COMPARISON:  None. FINDINGS: Normal cardiac silhouette. Aortic atherosclerosis with calcification. Mild bronchitic changes. No focal consolidation no pleural effusion or pneumothorax. No acute osseous abnormality is evident. IMPRESSION: Mild bronchitic changes. No focal consolidation. Aortic atherosclerosis. Electronically Signed   By: Mitzi Hansen M.D.   On: 11/16/2017 17:27   US Abdomen Limited Ruq  Result Date: 11/16/2017 CLINICAL DATA:  Elevated liver function test with sepsis. EXAM: ULTRASOUND ABDOMEN LIMITED RIGHT UPPER QUADRANT COMPARISON:  None. FINDINGS: Gallbladder: The gallbladder is physiologically distended without focal mural thickening. Gallbladder wall is 2.2 mm in single wall thickness which is within normal limits. There is layering biliary sludge with a mobile 1.9 cm gallstone noted near  the neck. No sonographic Murphy sign noted by sonographer. Common bile duct: Diameter: Ranged in size from 6.2 mm approximately the 10.4 mm distally. No choledocholithiasis. Liver: No focal lesion identified. Within normal limits in parenchymal echogenicity. Portal vein is  patent on color Doppler imaging with normal direction of blood flow towards the liver. IMPRESSION: Uncomplicated cholelithiasis with biliary sludge. Electronically Signed   By: Tollie Eth M.D.   On: 11/16/2017 19:25     CBC Recent Labs  Lab 11/16/17 1657 11/17/17 0506 11/18/17 0630  WBC 5.2 8.7 8.6  HGB 12.4 12.8 11.2*  HCT 37.1 38.9 33.5*  PLT 131* 120* 108*  MCV 89.3 91.6 89.8  MCH 30.0 30.1 30.1  MCHC 33.6 32.9 33.6  RDW 15.2* 15.5* 15.7*  LYMPHSABS 0.5*  --  0.3*  MONOABS 0.4  --  0.5  EOSABS 0.0  --  0.0  BASOSABS 0.0  --  0.0    Chemistries  Recent Labs  Lab 11/16/17 1657 11/17/17 0506 11/18/17 0630 11/21/17 0744  NA 140 143 139 145  K 3.9 4.2 3.7 3.4*  CL 108 112* 108 113*  CO2 23 22 23 24   GLUCOSE 91 92 317* 115*  BUN 32* 29* 21 26*  CREATININE 1.56* 1.45* 1.06* 1.05*  CALCIUM 8.9 8.7* 8.8* 8.9  AST 142* 93* 162*  --   ALT 316* 240* 246*  --   ALKPHOS 158* 135* 320*  --   BILITOT 2.9* 2.3* 4.4*  --    ------------------------------------------------------------------------------------------------------------------ estimated creatinine clearance is 56.1 mL/min (A) (by C-G formula based on SCr of 1.05 mg/dL (H)). ------------------------------------------------------------------------------------------------------------------ No results for input(s): HGBA1C in the last 72 hours. ------------------------------------------------------------------------------------------------------------------ No results for input(s): CHOL, HDL, LDLCALC, TRIG, CHOLHDL, LDLDIRECT in the last 72 hours. ------------------------------------------------------------------------------------------------------------------ No results for input(s): TSH, T4TOTAL, T3FREE, THYROIDAB in the last 72 hours.  Invalid input(s): FREET3 ------------------------------------------------------------------------------------------------------------------ No results for input(s): VITAMINB12, FOLATE,  FERRITIN, TIBC, IRON, RETICCTPCT in the last 72 hours.  Coagulation profile Recent Labs  Lab 11/16/17 2246  INR 1.14    No results for input(s): DDIMER in the last 72 hours.  Cardiac Enzymes Recent Labs  Lab 11/16/17 2246  TROPONINI <0.03   ------------------------------------------------------------------------------------------------------------------ Invalid input(s): POCBNP    Assessment & Plan   *Sepsis Fever of unknown origin cultures negative   continue IV doxycycline    *Altered mental status likely metabolic encephalopathy  MRI without stroke EEG negative Continue seizure medications I will check a ABG make sure she is not retaining PCO2 Start patient on D5 containing IV fluid Check ammonia level  *Seizure continue IV Keppra being transitioned to VPA EEG negative and neurology consult appreciated VPA level still low I will increase the dose   *Respiratory distress I will add IV Solu-Medrol Continue scopolamine Continue nebulizer therapy    *Dementia Continue baseline home medicines when able to take po  *Acute on chronic renal insufficiency resolved  *Elevated LFT Hepatitis panel negative Ultrasound shows no liver abnormality  *Essential hypertension not on any medications continue monitor  *Hyperlipidemia cholesterol medications discontinued  Prognosis very poor      Code Status Orders  (From admission, onward)        Start     Ordered   11/16/17 2220  Do not attempt resuscitation (DNR)  Continuous    Question Answer Comment  In the event of cardiac or respiratory ARREST Do not call a "code blue"   In the event of cardiac or respiratory ARREST Do not perform Intubation, CPR,  defibrillation or ACLS   In the event of cardiac or respiratory ARREST Use medication by any route, position, wound care, and other measures to relive pain and suffering. May use oxygen, suction and manual treatment of airway obstruction as needed for comfort.       11/16/17 2219    Code Status History    This patient has a current code status but no historical code status.           Consults none  DVT Prophylaxis  Lovenox   Lab Results  Component Value Date   PLT 108 (L) 11/18/2017     Time Spent in minutes   35 minutes greater than 50% of time spent in care coordination and counseling patient regarding the condition and plan of care.   Auburn Bilberry M.D on 11/21/2017 at 12:05 PM  Between 7am to 6pm - Pager - [phone]  After 6pm go to [email or web address] - Social research officer, government  [employer]   Office  [phone]

## 2017-11-21 NOTE — Progress Notes (Signed)
Subjective: Patient with eyes open today.  Still odes not follow commands.    Objective: Current vital signs: BP 127/62   Pulse 90   Temp 98.2 F (36.8 C) (Oral)   Resp (!) 22   Ht 5\' 5"  (1.651 m)   Wt 103.5 kg (228 lb 2 oz)   SpO2 98%   BMI 37.96 kg/m  Vital signs in last 24 hours: Temp:  [98.2 F (36.8 C)-98.7 F (37.1 C)] 98.2 F (36.8 C) (07/14 0517) Pulse Rate:  [80-90] 90 (07/14 0620) Resp:  [18-22] 22 (07/14 0517) BP: (127-173)/(62-86) 127/62 (07/14 0620) SpO2:  [90 %-98 %] 98 % (07/14 0517)  Intake/Output from previous day: 07/13 0701 - 07/14 0700 In: 1879.3 [I.V.:1250; IV Piggyback:629.3] Out: 1200 [Urine:1200] Intake/Output this shift: No intake/output data recorded. Nutritional status:  Diet Order           Diet NPO time specified Except for: Ice Chips, Sips with Meds  Diet effective now          Neurologic Exam: Mental Status: Eyes open.Some attempts at speech noted. Does not follow commands.  Cranial Nerves: II: Discs flat bilaterally;Pupils equal, round, reactive to light and accommodation III,IV,VI: doll's response absent bilaterally.  V,VII: corneal reflexpresentbilaterally VIII: patient does not respond to verbal stimuli IX,X: gag reflexunable to be tested, XI: trapezius strength unable to test bilaterally XII: tongue strength unable to test Motor: Moves extremities spontaneously.   Lab Results: Basic Metabolic Panel: Recent Labs  Lab 11/16/17 1657 11/17/17 0506 11/18/17 0630 11/21/17 0744  NA 140 143 139 145  K 3.9 4.2 3.7 3.4*  CL 108 112* 108 113*  CO2 23 22 23 24   GLUCOSE 91 92 317* 115*  BUN 32* 29* 21 26*  CREATININE 1.56* 1.45* 1.06* 1.05*  CALCIUM 8.9 8.7* 8.8* 8.9    Liver Function Tests: Recent Labs  Lab 11/16/17 1657 11/17/17 0506 11/18/17 0630  AST 142* 93* 162*  ALT 316* 240* 246*  ALKPHOS 158* 135* 320*  BILITOT 2.9* 2.3* 4.4*  PROT 7.6 7.0 7.0  ALBUMIN 3.1* 2.8* 2.5*   No results for  input(s): LIPASE, AMYLASE in the last 168 hours. No results for input(s): AMMONIA in the last 168 hours.  CBC: Recent Labs  Lab 11/16/17 1657 11/17/17 0506 11/18/17 0630  WBC 5.2 8.7 8.6  NEUTROABS 4.3  --  7.8*  HGB 12.4 12.8 11.2*  HCT 37.1 38.9 33.5*  MCV 89.3 91.6 89.8  PLT 131* 120* 108*    Cardiac Enzymes: Recent Labs  Lab 11/16/17 2246  TROPONINI <0.03    Lipid Panel: No results for input(s): CHOL, TRIG, HDL, CHOLHDL, VLDL, LDLCALC in the last 168 hours.  CBG: Recent Labs  Lab 11/20/17 1152 11/20/17 1633 11/20/17 2155 11/21/17 0745 11/21/17 1132  GLUCAP 91 89 106* 117* 97    Microbiology: Results for orders placed or performed during the hospital encounter of 11/16/17  Urine culture     Status: None   Collection Time: 11/16/17  4:48 PM  Result Value Ref Range Status   Specimen Description   Final    URINE, RANDOM Performed at [facility], [address] [place], [place] [address]    Special Requests   Final    NONE Performed at [facility], [address], [place], [place] [address]    Culture   Final    NO GROWTH Performed at [facility], [address] [place], [place] [address]    Report Status 11/17/2017  FINAL  Final  Culture, blood (Routine x 2)     Status: None   Collection Time: 11/16/17  4:57 PM  Result Value Ref Range Status   Specimen Description BLOOD LEFT FOREARM  Final   Special Requests   Final    BOTTLES DRAWN AEROBIC AND ANAEROBIC Blood Culture results may not be optimal due to an inadequate volume of blood received in culture bottles   Culture   Final    NO GROWTH 5 DAYS Performed at [facility], [address] [place], [place] [address]    Report Status 11/21/2017 FINAL  Final  Culture, blood (Routine x 2)     Status: None   Collection Time: 11/16/17  5:02 PM  Result Value Ref Range Status   Specimen Description BLOOD RIGHT FOREARM  Final   Special Requests   Final    BOTTLES  DRAWN AEROBIC AND ANAEROBIC Blood Culture results may not be optimal due to an inadequate volume of blood received in culture bottles   Culture   Final    NO GROWTH 5 DAYS Performed at [facility], [address] [place], [place] [address]    Report Status 11/21/2017 FINAL  Final  Respiratory Panel by PCR     Status: None   Collection Time: 11/17/17  8:41 AM  Result Value Ref Range Status   Adenovirus NOT DETECTED NOT DETECTED Final   Coronavirus 229E NOT DETECTED NOT DETECTED Final   Coronavirus HKU1 NOT DETECTED NOT DETECTED Final   Coronavirus NL63 NOT DETECTED NOT DETECTED Final   Coronavirus OC43 NOT DETECTED NOT DETECTED Final   Metapneumovirus NOT DETECTED NOT DETECTED Final   Rhinovirus / Enterovirus NOT DETECTED NOT DETECTED Final   Influenza A NOT DETECTED NOT DETECTED Final   Influenza B NOT DETECTED NOT DETECTED Final   Parainfluenza Virus 1 NOT DETECTED NOT DETECTED Final   Parainfluenza Virus 2 NOT DETECTED NOT DETECTED Final   Parainfluenza Virus 3 NOT DETECTED NOT DETECTED Final   Parainfluenza Virus 4 NOT DETECTED NOT DETECTED Final   Respiratory Syncytial Virus NOT DETECTED NOT DETECTED Final   Bordetella pertussis NOT DETECTED NOT DETECTED Final   Chlamydophila pneumoniae NOT DETECTED NOT DETECTED Final   Mycoplasma pneumoniae NOT DETECTED NOT DETECTED Final    Comment: Performed at [facility], [address] [place], [place] [address]  MRSA PCR Screening     Status: None   Collection Time: 11/17/17  6:54 PM  Result Value Ref Range Status   MRSA by PCR NEGATIVE NEGATIVE Final    Comment:        The GeneXpert MRSA Assay (FDA approved for NASAL specimens only), is one component of a comprehensive MRSA colonization surveillance program. It is not intended to diagnose MRSA infection nor to guide or monitor treatment for MRSA infections. Performed at [facility], [address] [place], [place] [address]     Coagulation  Studies: No results for input(s): LABPROT, INR in the last 72 hours.  Imaging: Dg Chest Port 1 View  Result Date: 11/20/2017 CLINICAL DATA:  SOB per physician order. Pt admitted 11/16/2017 for sepsis and altered mental status likely metabolic encephalopathy. Hx - HTN, dementia, acute on chronic renal insufficiency, former smoker. EXAM: PORTABLE CHEST 1 VIEW COMPARISON:  11/17/2017 FINDINGS: Cardiac silhouette is normal in size. No mediastinal or hilar masses. No evidence of adenopathy. There are prominent bronchovascular markings accentuated by low lung volumes. No evidence of pneumonia or pulmonary edema. No  pleural effusion or pneumothorax. Skeletal structures are demineralized but grossly intact IMPRESSION: No active disease. Electronically Signed   By: Amie Portlandavid  Ormond M.D.   On: 11/20/2017 14:46    Medications:  I have reviewed the patient's current medications. Scheduled: . aspirin EC  81 mg Oral Daily  . enoxaparin (LOVENOX) injection  40 mg Subcutaneous BID  . feeding supplement (ENSURE ENLIVE)  237 mL Oral BID BM  . insulin aspart  0-5 Units Subcutaneous QHS  . insulin aspart  0-9 Units Subcutaneous TID WC  . insulin aspart  3 Units Subcutaneous TID WC  . ipratropium-albuterol  3 mL Nebulization BID  . memantine  10 mg Oral BID  . methylPREDNISolone (SOLU-MEDROL) injection  60 mg Intravenous Q6H  . modafinil  200 mg Oral Daily  . multivitamin with minerals   Oral Daily  . nystatin  5 mL Mouth/Throat QID  . scopolamine  1 patch Transdermal Q72H    Assessment/Plan: Patient some improved today.  Keppra discontinued.  Depakote level low at 19.  No further seizures noted.    Recommendations: 1.  Increase Depakote to 1000mg  IV q 12 hours 2.  Continue seizure precautions 3.  Depakote level in AM    LOS: 5 days   Thana FarrLeslie Naylea Wigington, MD Neurology [phone] 11/21/2017  12:56 PM

## 2017-11-22 ENCOUNTER — Inpatient Hospital Stay: Payer: Medicare (Managed Care)

## 2017-11-22 LAB — BASIC METABOLIC PANEL
Anion gap: 6 (ref 5–15)
BUN: 30 mg/dL — AB (ref 8–23)
CO2: 24 mmol/L (ref 22–32)
Calcium: 8.8 mg/dL — ABNORMAL LOW (ref 8.9–10.3)
Chloride: 112 mmol/L — ABNORMAL HIGH (ref 98–111)
Creatinine, Ser: 1.08 mg/dL — ABNORMAL HIGH (ref 0.44–1.00)
GFR calc Af Amer: 57 mL/min — ABNORMAL LOW (ref >60)
GFR, EST NON AFRICAN AMERICAN: 49 mL/min — AB (ref >60)
GLUCOSE: 217 mg/dL — AB (ref 70–99)
POTASSIUM: 4.2 mmol/L (ref 3.5–5.1)
SODIUM: 142 mmol/L (ref 135–145)

## 2017-11-22 LAB — CBC
HCT: 33.1 % — ABNORMAL LOW (ref 35.0–47.0)
Hemoglobin: 11.4 g/dL — ABNORMAL LOW (ref 12.0–16.0)
MCH: 30.4 pg (ref 26.0–34.0)
MCHC: 34.4 g/dL (ref 32.0–36.0)
MCV: 88.5 fL (ref 80.0–100.0)
Platelets: 177 10*3/uL (ref 150–440)
RBC: 3.74 MIL/uL — AB (ref 3.80–5.20)
RDW: 15.3 % — AB (ref 11.5–14.5)
WBC: 8.9 10*3/uL (ref 3.6–11.0)

## 2017-11-22 LAB — CSF CELL COUNT WITH DIFFERENTIAL
Eosinophils, CSF: 0 %
LYMPHS CSF: 97 %
Monocyte-Macrophage-Spinal Fluid: 3 %
Other Cells, CSF: 0
RBC Count, CSF: 2 /mm3 (ref 0–3)
Segmented Neutrophils-CSF: 0 %
TUBE #: 3
WBC CSF: 10 /mm3 — AB (ref 0–5)

## 2017-11-22 LAB — GLUCOSE, CAPILLARY
GLUCOSE-CAPILLARY: 171 mg/dL — AB (ref 70–99)
GLUCOSE-CAPILLARY: 153 mg/dL — AB (ref 70–99)
Glucose-Capillary: 148 mg/dL — ABNORMAL HIGH (ref 70–99)
Glucose-Capillary: 164 mg/dL — ABNORMAL HIGH (ref 70–99)

## 2017-11-22 LAB — GLUCOSE, CSF: Glucose, CSF: 110 mg/dL — ABNORMAL HIGH (ref 40–70)

## 2017-11-22 LAB — VALPROIC ACID LEVEL: Valproic Acid Lvl: 41 ug/mL — ABNORMAL LOW (ref 50.0–100.0)

## 2017-11-22 LAB — PROTEIN, CSF: Total  Protein, CSF: 57 mg/dL — ABNORMAL HIGH (ref 15–45)

## 2017-11-22 MED ORDER — ENOXAPARIN SODIUM 40 MG/0.4ML ~~LOC~~ SOLN
40.0000 mg | SUBCUTANEOUS | Status: DC
  Administered 2017-11-23 – 2017-11-26 (×4): 40 mg via SUBCUTANEOUS
  Filled 2017-11-22 (×4): qty 0.4

## 2017-11-22 NOTE — Progress Notes (Signed)
[employer] - [place] at [facility]                                                                                                                                                                                  Patient Demographics   Sheena Mitchell, is a 77 y.o. female, DOB - 03-Nov-1940, ZOX:[id number]  Admit date - 11/16/2017   Admitting Physician Altamese Dilling, MD  Outpatient Primary MD for the patient is Center, [facility]   LOS - 6  Subjective: Patient more awake today according to the family is was talking all morning. Currently sleepy but awakes when stimulated   Review of Systems:   CONSTITUTIONAL: Sleepy unable to provide Vitals:   Vitals:   11/21/17 1334 11/21/17 2008 11/21/17 2012 11/22/17 0515  BP: (!) 164/82 (!) 180/81  (!) 156/81  Pulse: 93 82  89  Resp: 18 18  18   Temp: 99.6 F (37.6 C) 99.6 F (37.6 C)  98 F (36.7 C)  TempSrc: Oral Axillary  Axillary  SpO2: 96% 98% 98% 100%  Weight:      Height:        Wt Readings from Last 3 Encounters:  11/19/17 103.5 kg (228 lb 2 oz)  10/09/15 99.8 kg (220 lb)  10/02/15 99.8 kg (220 lb)     Intake/Output Summary (Last 24 hours) at 11/22/2017 1308 Last data filed at 11/22/2017 1102 Gross per 24 hour  Intake 3175.25 ml  Output 1100 ml  Net 2075.25 ml    Physical Exam:   GENERAL: Critically ill  HEAD, EYES, EARS, NOSE AND THROAT: Atraumatic, normocephalic. . Pupils equal and reactive to light. Sclerae anicteric. No conjunctival injection. No oro-pharyngeal erythema.  NECK: Supple. There is no jugular venous distention. No bruits, no lymphadenopathy, no thyromegaly.  HEART: Regular rate and rhythm,. No murmurs, no rubs, no clicks.  LUNGS: Bilateral wheezing throughout both lung no accessory muscle usage ABDOMEN: Soft, flat, nontender, nondistended. Has good bowel sounds. No hepatosplenomegaly appreciated.  EXTREMITIES: No evidence of any cyanosis, clubbing, or peripheral  edema.  +2 pedal and radial pulses bilaterally.  NEUROLOGIC: Able to arouse  sKIN: Moist and warm with no rashes appreciated.  Psych: Not anxious, depressed LN: No inguinal LN enlargement    Antibiotics   Anti-infectives (From admission, onward)   Start     Dose/Rate Route Frequency Ordered Stop   11/19/17 1000  doxycycline (VIBRAMYCIN) 100 mg in sodium chloride 0.9 % 250 mL IVPB     100 mg 125 mL/hr over 120 Minutes Intravenous Every 12 hours 11/19/17 0850     11/18/17 1600  Ampicillin-Sulbactam (UNASYN) 3 g in sodium chloride 0.9 % 100 mL  IVPB  Status:  Discontinued     3 g 200 mL/hr over 30 Minutes Intravenous Every 6 hours 11/18/17 1552 11/19/17 0850   11/17/17 2200  vancomycin (VANCOCIN) IVPB 1000 mg/200 mL premix  Status:  Discontinued     1,000 mg 200 mL/hr over 60 Minutes Intravenous Every 18 hours 11/17/17 2002 11/17/17 2106   11/17/17 1100  piperacillin-tazobactam (ZOSYN) IVPB 3.375 g  Status:  Discontinued     3.375 g 12.5 mL/hr over 240 Minutes Intravenous Every 8 hours 11/17/17 0832 11/18/17 1046   11/17/17 1000  vancomycin (VANCOCIN) 1,250 mg in sodium chloride 0.9 % 250 mL IVPB  Status:  Discontinued     1,250 mg 166.7 mL/hr over 90 Minutes Intravenous Every 24 hours 11/17/17 0832 11/17/17 1432   11/16/17 1715  piperacillin-tazobactam (ZOSYN) IVPB 3.375 g     3.375 g 100 mL/hr over 30 Minutes Intravenous  Once 11/16/17 1701 11/16/17 1750   11/16/17 1715  vancomycin (VANCOCIN) IVPB 1000 mg/200 mL premix     1,000 mg 200 mL/hr over 60 Minutes Intravenous  Once 11/16/17 1701 11/16/17 1900      Medications   Scheduled Meds: . aspirin EC  81 mg Oral Daily  . [START ON 11/23/2017] enoxaparin (LOVENOX) injection  40 mg Subcutaneous Q24H  . feeding supplement (ENSURE ENLIVE)  237 mL Oral BID BM  . insulin aspart  0-5 Units Subcutaneous QHS  . insulin aspart  0-9 Units Subcutaneous TID WC  . insulin aspart  3 Units Subcutaneous TID WC  . ipratropium-albuterol  3 mL  Nebulization BID  . memantine  10 mg Oral BID  . methylPREDNISolone (SOLU-MEDROL) injection  60 mg Intravenous Q6H  . modafinil  200 mg Oral Daily  . multivitamin with minerals   Oral Daily  . nystatin  5 mL Mouth/Throat QID  . scopolamine  1 patch Transdermal Q72H   Continuous Infusions: . dextrose 5 % with KCl 20 mEq / L 20 mEq (11/22/17 0419)  . doxycycline (VIBRAMYCIN) IV 100 mg (11/22/17 1102)  . lactated ringers 50 mL/hr at 11/19/17 1123  . valproate sodium Stopped (11/22/17 1102)   PRN Meds:.acetaminophen, hydrALAZINE, ipratropium-albuterol   Data Review:   Micro Results Recent Results (from the past 240 hour(s))  Urine culture     Status: None   Collection Time: 11/16/17  4:48 PM  Result Value Ref Range Status   Specimen Description   Final    URINE, RANDOM Performed at [facility], [address] [place], [place] [address]    Special Requests   Final    NONE Performed at [facility], [address] [place], [place] [address]    Culture   Final    NO GROWTH Performed at [facility], [address], [place], [place] [address]    Report Status 11/17/2017 FINAL  Final  Culture, blood (Routine x 2)     Status: None   Collection Time: 11/16/17  4:57 PM  Result Value Ref Range Status   Specimen Description BLOOD LEFT FOREARM  Final   Special Requests   Final    BOTTLES DRAWN AEROBIC AND ANAEROBIC Blood Culture results may not be optimal due to an inadequate volume of blood received in culture bottles   Culture   Final    NO GROWTH 5 DAYS Performed at [facility], [address], [place], [place] [address]    Report Status 11/21/2017 FINAL  Final  Culture, blood (Routine x 2)  Status: None   Collection Time: 11/16/17  5:02 PM  Result Value Ref Range Status   Specimen Description BLOOD RIGHT FOREARM  Final   Special Requests   Final    BOTTLES DRAWN AEROBIC AND ANAEROBIC Blood Culture results may not be optimal due to  an inadequate volume of blood received in culture bottles   Culture   Final    NO GROWTH 5 DAYS Performed at [facility], [address] [place], [place] [address]    Report Status 11/21/2017 FINAL  Final  Respiratory Panel by PCR     Status: None   Collection Time: 11/17/17  8:41 AM  Result Value Ref Range Status   Adenovirus NOT DETECTED NOT DETECTED Final   Coronavirus 229E NOT DETECTED NOT DETECTED Final   Coronavirus HKU1 NOT DETECTED NOT DETECTED Final   Coronavirus NL63 NOT DETECTED NOT DETECTED Final   Coronavirus OC43 NOT DETECTED NOT DETECTED Final   Metapneumovirus NOT DETECTED NOT DETECTED Final   Rhinovirus / Enterovirus NOT DETECTED NOT DETECTED Final   Influenza A NOT DETECTED NOT DETECTED Final   Influenza B NOT DETECTED NOT DETECTED Final   Parainfluenza Virus 1 NOT DETECTED NOT DETECTED Final   Parainfluenza Virus 2 NOT DETECTED NOT DETECTED Final   Parainfluenza Virus 3 NOT DETECTED NOT DETECTED Final   Parainfluenza Virus 4 NOT DETECTED NOT DETECTED Final   Respiratory Syncytial Virus NOT DETECTED NOT DETECTED Final   Bordetella pertussis NOT DETECTED NOT DETECTED Final   Chlamydophila pneumoniae NOT DETECTED NOT DETECTED Final   Mycoplasma pneumoniae NOT DETECTED NOT DETECTED Final    Comment: Performed at [facility], [address] [place], [place] [address]  MRSA PCR Screening     Status: None   Collection Time: 11/17/17  6:54 PM  Result Value Ref Range Status   MRSA by PCR NEGATIVE NEGATIVE Final    Comment:        The GeneXpert MRSA Assay (FDA approved for NASAL specimens only), is one component of a comprehensive MRSA colonization surveillance program. It is not intended to diagnose MRSA infection nor to guide or monitor treatment for MRSA infections. Performed at [facility], [address] [place], [place] [address]   CSF culture     Status: None (Preliminary result)   Collection Time: 11/22/17  9:28 AM  Result  Value Ref Range Status   Specimen Description CSF  Final   Special Requests NONE  Final   Gram Stain   Final    NO ORGANISMS SEEN RARE RED BLOOD CELLS FEW WBC SEEN Performed at [facility], [address] [place], [place] [address]    Culture PENDING  Incomplete   Report Status PENDING  Incomplete    Radiology Reports Ct Head Wo Contrast  Result Date: 11/17/2017 CLINICAL DATA:  77 y/o F; acute mental status change. Fever and suspected seizure. EXAM: CT HEAD WITHOUT CONTRAST TECHNIQUE: Contiguous axial images were obtained from the base of the skull through the vertex without intravenous contrast. COMPARISON:  11/16/2017 CT head FINDINGS: Brain: No evidence of acute infarction, hemorrhage, hydrocephalus, extra-axial collection or mass lesion/mass effect. Stable chronic microvascular ischemic changes and parenchymal volume loss of the brain. Vascular: Calcific atherosclerosis of carotid siphons. No hyperdense vessel. Skull: Normal. Negative for fracture or focal lesion. Sinuses/Orbits: No acute finding. Other: None. IMPRESSION: 1. No acute intracranial abnormality identified. 2. Stable chronic microvascular ischemic changes and parenchymal volume loss of the brain. Electronically Signed  By: Mitzi Hansen M.D.   On: 11/17/2017 18:41   Ct Head Wo Contrast  Result Date: 11/16/2017 CLINICAL DATA:  Dementia. Altered mental status and progressive weakness. EXAM: CT HEAD WITHOUT CONTRAST TECHNIQUE: Contiguous axial images were obtained from the base of the skull through the vertex without intravenous contrast. COMPARISON:  08/05/2017 FINDINGS: Brain: Mild age related involutional changes of the brain with chronic minimal small vessel ischemia of periventricular and subcortical white matter. No acute intracranial hemorrhage, mass or midline shift. No large vascular territory infarct. No extra-axial fluid collections. Midline fourth ventricle and basal cisterns without effacement.  The brainstem and cerebellum are stable. Calcifications along the tentorium are stable. Vascular: No hyperdense vessel sign. Skull: Intact Sinuses/Orbits: Nonacute Other: None IMPRESSION: Minimal chronic small vessel ischemic disease. No acute intracranial abnormality. Electronically Signed   By: Tollie Eth M.D.   On: 11/16/2017 21:12   Mr Brain Wo Contrast  Result Date: 11/18/2017 CLINICAL DATA:  77 y/o F; found down. History of dementia and hypertension. Sepsis. EXAM: MRI HEAD WITHOUT CONTRAST TECHNIQUE: Multiplanar, multiecho pulse sequences of the brain and surrounding structures were obtained without intravenous contrast. COMPARISON:  11/17/2017 CT head.  10/09/2015 MRI head. FINDINGS: Brain: Extensive motion degradation on multiple sequences. No reduced diffusion to suggest acute or early subacute infarction. Stable nonspecific foci of T2 FLAIR hyperintense signal abnormality in subcortical and periventricular white matter are compatible with mild chronic microvascular ischemic changes for age. Mild brain parenchymal volume loss. No focal mass effect, extra-axial collection, hydrocephalus, or herniation. Punctate stable focus of susceptibility hypointensity in left cerebellar hemisphere compatible with chronic hemosiderin deposition from microhemorrhage. Vascular: Normal flow voids. Skull and upper cervical spine: Normal marrow signal. Sinuses/Orbits: Right maxillary sinus mucous retention cyst. No significant abnormal signal of mastoid air cells. Orbits are unremarkable. Other: None. IMPRESSION: 1. Motion degraded study. 2. No acute intracranial abnormality identified. 3. Stable mild chronic microvascular ischemic changes and parenchymal volume loss of the brain. Electronically Signed   By: Mitzi Hansen M.D.   On: 11/18/2017 14:33   Dg Chest Port 1 View  Result Date: 11/20/2017 CLINICAL DATA:  SOB per physician order. Pt admitted 11/16/2017 for sepsis and altered mental status likely  metabolic encephalopathy. Hx - HTN, dementia, acute on chronic renal insufficiency, former smoker. EXAM: PORTABLE CHEST 1 VIEW COMPARISON:  11/17/2017 FINDINGS: Cardiac silhouette is normal in size. No mediastinal or hilar masses. No evidence of adenopathy. There are prominent bronchovascular markings accentuated by low lung volumes. No evidence of pneumonia or pulmonary edema. No pleural effusion or pneumothorax. Skeletal structures are demineralized but grossly intact IMPRESSION: No active disease. Electronically Signed   By: Amie Portland M.D.   On: 11/20/2017 14:46   Dg Chest Port 1 View  Result Date: 11/17/2017 CLINICAL DATA:  Fever, unresponsive. EXAM: PORTABLE CHEST 1 VIEW COMPARISON:  Radiograph of November 16, 2017. FINDINGS: The heart size and mediastinal contours are within normal limits. Atherosclerosis of thoracic aorta is noted. Hypoinflation of the lungs is noted with minimal bibasilar subsegmental atelectasis. The visualized skeletal structures are unremarkable. IMPRESSION: Hypoinflation of lungs with minimal bibasilar subsegmental atelectasis. Aortic Atherosclerosis (ICD10-I70.0). Electronically Signed   By: Lupita Raider, M.D.   On: 11/17/2017 18:07   Dg Chest Port 1 View  Result Date: 11/16/2017 CLINICAL DATA:  77 y/o F; altered mental status. Abnormal breath sounds. Sepsis. EXAM: PORTABLE CHEST 1 VIEW COMPARISON:  None. FINDINGS: Normal cardiac silhouette. Aortic atherosclerosis with calcification. Mild bronchitic changes. No focal consolidation no  pleural effusion or pneumothorax. No acute osseous abnormality is evident. IMPRESSION: Mild bronchitic changes. No focal consolidation. Aortic atherosclerosis. Electronically Signed   By: Mitzi Hansen M.D.   On: 11/16/2017 17:27   US Abdomen Limited Ruq  Result Date: 11/16/2017 CLINICAL DATA:  Elevated liver function test with sepsis. EXAM: ULTRASOUND ABDOMEN LIMITED RIGHT UPPER QUADRANT COMPARISON:  None. FINDINGS: Gallbladder: The  gallbladder is physiologically distended without focal mural thickening. Gallbladder wall is 2.2 mm in single wall thickness which is within normal limits. There is layering biliary sludge with a mobile 1.9 cm gallstone noted near the neck. No sonographic Murphy sign noted by sonographer. Common bile duct: Diameter: Ranged in size from 6.2 mm approximately the 10.4 mm distally. No choledocholithiasis. Liver: No focal lesion identified. Within normal limits in parenchymal echogenicity. Portal vein is patent on color Doppler imaging with normal direction of blood flow towards the liver. IMPRESSION: Uncomplicated cholelithiasis with biliary sludge. Electronically Signed   By: Tollie Eth M.D.   On: 11/16/2017 19:25   Dg Fluoro Guided Loc Of Needle/cath Tip For Spinal Inject Lt  Result Date: 11/22/2017 CLINICAL DATA:  Confusion EXAM: DIAGNOSTIC LUMBAR PUNCTURE UNDER FLUOROSCOPIC GUIDANCE FLUOROSCOPY TIME:  Fluoroscopy Time:  0.4 minute Radiation Exposure Index (if provided by the fluoroscopic device): 5.9 mGy Number of Acquired Spot Images: 0 PROCEDURE: Informed consent was obtained from the patient prior to the procedure, including potential complications of headache, allergy, and pain. With the patient prone, the lower back was prepped with Betadine. 1% Lidocaine was used for local anesthesia. Lumbar puncture was performed at the L3-4 level using a 22 gauge needle with return of clear CSF. 9 ml of CSF were obtained for laboratory studies. The patient tolerated the procedure well and there were no apparent complications. IMPRESSION: Successful fluoroscopic guided lumbar puncture. Electronically Signed   By: Elige Ko   On: 11/22/2017 09:47     CBC Recent Labs  Lab 11/16/17 1657 11/17/17 0506 11/18/17 0630 11/22/17 0509  WBC 5.2 8.7 8.6 8.9  HGB 12.4 12.8 11.2* 11.4*  HCT 37.1 38.9 33.5* 33.1*  PLT 131* 120* 108* 177  MCV 89.3 91.6 89.8 88.5  MCH 30.0 30.1 30.1 30.4  MCHC 33.6 32.9 33.6 34.4  RDW  15.2* 15.5* 15.7* 15.3*  LYMPHSABS 0.5*  --  0.3*  --   MONOABS 0.4  --  0.5  --   EOSABS 0.0  --  0.0  --   BASOSABS 0.0  --  0.0  --     Chemistries  Recent Labs  Lab 11/16/17 1657 11/17/17 0506 11/18/17 0630 11/21/17 0744 11/21/17 1227 11/22/17 0509  NA 140 143 139 145  --  142  K 3.9 4.2 3.7 3.4*  --  4.2  CL 108 112* 108 113*  --  112*  CO2 23 22 23 24   --  24  GLUCOSE 91 92 317* 115*  --  217*  BUN 32* 29* 21 26*  --  30*  CREATININE 1.56* 1.45* 1.06* 1.05*  --  1.08*  CALCIUM 8.9 8.7* 8.8* 8.9  --  8.8*  AST 142* 93* 162*  --  126*  --   ALT 316* 240* 246*  --  201*  --   ALKPHOS 158* 135* 320*  --  204*  --   BILITOT 2.9* 2.3* 4.4*  --  1.8*  --    ------------------------------------------------------------------------------------------------------------------ estimated creatinine clearance is 54.5 mL/min (A) (by C-G formula based on SCr of 1.08 mg/dL (H)). ------------------------------------------------------------------------------------------------------------------  No results for input(s): HGBA1C in the last 72 hours. ------------------------------------------------------------------------------------------------------------------ No results for input(s): CHOL, HDL, LDLCALC, TRIG, CHOLHDL, LDLDIRECT in the last 72 hours. ------------------------------------------------------------------------------------------------------------------ No results for input(s): TSH, T4TOTAL, T3FREE, THYROIDAB in the last 72 hours.  Invalid input(s): FREET3 ------------------------------------------------------------------------------------------------------------------ No results for input(s): VITAMINB12, FOLATE, FERRITIN, TIBC, IRON, RETICCTPCT in the last 72 hours.  Coagulation profile Recent Labs  Lab 11/16/17 2246  INR 1.14    No results for input(s): DDIMER in the last 72 hours.  Cardiac Enzymes Recent Labs  Lab 11/16/17 2246  TROPONINI <0.03    ------------------------------------------------------------------------------------------------------------------ Invalid input(s): POCBNP    Assessment & Plan   *Sepsis Fever of unknown origin cultures negative   continue IV doxycycline  Await CSF cultures  *Altered mental status likely metabolic encephalopathy  MRI without stroke EEG negative Continue seizure medications Start patient on D5 containing IV fluid Ammonia level normal  *Seizure continue IV  VPA EEG negative and neurology consult appreciated   *Respiratory distress now improved continue scopolamine Continue nebulizer therapy  *Dementia Continue baseline home medicines when able to take po  *Acute on chronic renal insufficiency resolved  *Elevated LFT Hepatitis panel negative Ultrasound shows no liver abnormality  *Essential hypertension not on any medications continue monitor  *Hyperlipidemia cholesterol medications discontinued  Prognosis very poor      Code Status Orders  (From admission, onward)        Start     Ordered   11/16/17 2220  Do not attempt resuscitation (DNR)  Continuous    Question Answer Comment  In the event of cardiac or respiratory ARREST Do not call a "code blue"   In the event of cardiac or respiratory ARREST Do not perform Intubation, CPR, defibrillation or ACLS   In the event of cardiac or respiratory ARREST Use medication by any route, position, wound care, and other measures to relive pain and suffering. May use oxygen, suction and manual treatment of airway obstruction as needed for comfort.      11/16/17 2219    Code Status History    This patient has a current code status but no historical code status.           Consults none  DVT Prophylaxis  Lovenox   Lab Results  Component Value Date   PLT 177 11/22/2017     Time Spent in minutes   35 minutes greater than 50% of time spent in care coordination and counseling patient regarding the  condition and plan of care.   Auburn Bilberry M.D on 11/22/2017 at 1:08 PM  Between 7am to 6pm - Pager - [phone]  After 6pm go to [email or web address] - Social research officer, government  [employer]   Office  [phone]

## 2017-11-22 NOTE — Progress Notes (Signed)
Chaplain followed up with Pt. Sheena Mitchell of [facility] and brother were at the bedside. Chaplain spoke with brother about arraignments for Pt's daughter. Chaplain prayed with Pt and those at her bedside for healing and peace.    11/22/17 1100  Clinical Encounter Type  Visited With Patient and family together  Visit Type Spiritual support  Referral From Chaplain  Spiritual Encounters  Spiritual Needs Prayer

## 2017-11-22 NOTE — Clinical Social Work Note (Signed)
CSW spoke with Sheena SmilesNicole Mitchell from [facility] to discuss discharge plan. Sheena Mitchell asked how patient is progressing and what the discharge disposition will be. CSW explained the PT recommendation of SNF and that patient is currently needing 2 person assist. Sheena Mitchell states that patient can be discharged home with [facility] and that they can manage her needs there. Sheena Mitchell asked that CSW contact [facility] or [facility] at [phone] when patient is ready for discharge and they will transport her and set up services. CSW will continue to follow for discharge planning.   [name] Tmya Wigington MSW, [address]CSWA [phone]

## 2017-11-22 NOTE — Progress Notes (Signed)
Subjective: Patient with more speech today but does not follow commands.  Per family talked with sister on the phone.  LP performed.   No further seizures noted.  Objective: Current vital signs: BP (!) 156/81 (BP Location: Right Arm)   Pulse 89   Temp 98 F (36.7 C) (Axillary)   Resp 18   Ht 5\' 5"  (1.651 m)   Wt 103.5 kg (228 lb 2 oz)   SpO2 100%   BMI 37.96 kg/m  Vital signs in last 24 hours: Temp:  [98 F (36.7 C)-99.6 F (37.6 C)] 98 F (36.7 C) (07/15 0515) Pulse Rate:  [82-93] 89 (07/15 0515) Resp:  [18] 18 (07/15 0515) BP: (156-180)/(81-82) 156/81 (07/15 0515) SpO2:  [96 %-100 %] 100 % (07/15 0515)  Intake/Output from previous day: 07/14 0701 - 07/15 0700 In: 3113.3 [I.V.:2520; IV Piggyback:593.3] Out: 1100 [Urine:1100] Intake/Output this shift: No intake/output data recorded. Nutritional status:  Diet Order           Diet NPO time specified Except for: Ice Chips, Sips with Meds  Diet effective now          Neurologic Exam: Mental Status: Eyes open.Responds when I say good morning and ask how she is doing but otherwise no speech.  Does not follow commands.  Cranial Nerves: II: Discs flat bilaterally;Pupils equal, round, reactive to light and accommodation III,IV,VI: doll's response absent bilaterally.  V,VII: corneal reflexpresentbilaterally VIII: grossly intact IX,X: gag reflexunable to be tested, XI: trapezius strength unable to test bilaterally XII: tongue strength unable to test Motor: Moves extremities spontaneously.   Lab Results: Basic Metabolic Panel: Recent Labs  Lab 11/16/17 1657 11/17/17 0506 11/18/17 0630 11/21/17 0744 11/22/17 0509  NA 140 143 139 145 142  K 3.9 4.2 3.7 3.4* 4.2  CL 108 112* 108 113* 112*  CO2 23 22 23 24 24   GLUCOSE 91 92 317* 115* 217*  BUN 32* 29* 21 26* 30*  CREATININE 1.56* 1.45* 1.06* 1.05* 1.08*  CALCIUM 8.9 8.7* 8.8* 8.9 8.8*    Liver Function Tests: Recent Labs  Lab 11/16/17 1657  11/17/17 0506 11/18/17 0630 11/21/17 1227  AST 142* 93* 162* 126*  ALT 316* 240* 246* 201*  ALKPHOS 158* 135* 320* 204*  BILITOT 2.9* 2.3* 4.4* 1.8*  PROT 7.6 7.0 7.0 6.7  ALBUMIN 3.1* 2.8* 2.5* 2.6*   No results for input(s): LIPASE, AMYLASE in the last 168 hours. Recent Labs  Lab 11/21/17 1227  AMMONIA 18    CBC: Recent Labs  Lab 11/16/17 1657 11/17/17 0506 11/18/17 0630 11/22/17 0509  WBC 5.2 8.7 8.6 8.9  NEUTROABS 4.3  --  7.8*  --   HGB 12.4 12.8 11.2* 11.4*  HCT 37.1 38.9 33.5* 33.1*  MCV 89.3 91.6 89.8 88.5  PLT 131* 120* 108* 177    Cardiac Enzymes: Recent Labs  Lab 11/16/17 2246  TROPONINI <0.03    Lipid Panel: No results for input(s): CHOL, TRIG, HDL, CHOLHDL, VLDL, LDLCALC in the last 168 hours.  CBG: Recent Labs  Lab 11/21/17 0745 11/21/17 1132 11/21/17 1632 11/21/17 2015 11/22/17 0755  GLUCAP 117* 97 164* 163* 171*    Microbiology: Results for orders placed or performed during the hospital encounter of 11/16/17  Urine culture     Status: None   Collection Time: 11/16/17  4:48 PM  Result Value Ref Range Status   Specimen Description   Final    URINE, RANDOM Performed at [facility], [address] [place], [place] [address]  Special Requests   Final    NONE Performed at [facility], [address], [place], [place] [address]    Culture   Final    NO GROWTH Performed at [facility], [address] [place], [place] [address]    Report Status 11/17/2017 FINAL  Final  Culture, blood (Routine x 2)     Status: None   Collection Time: 11/16/17  4:57 PM  Result Value Ref Range Status   Specimen Description BLOOD LEFT FOREARM  Final   Special Requests   Final    BOTTLES DRAWN AEROBIC AND ANAEROBIC Blood Culture results may not be optimal due to an inadequate volume of blood received in culture bottles   Culture   Final    NO GROWTH 5 DAYS Performed at [facility], [address]  [place], [place] [address]    Report Status 11/21/2017 FINAL  Final  Culture, blood (Routine x 2)     Status: None   Collection Time: 11/16/17  5:02 PM  Result Value Ref Range Status   Specimen Description BLOOD RIGHT FOREARM  Final   Special Requests   Final    BOTTLES DRAWN AEROBIC AND ANAEROBIC Blood Culture results may not be optimal due to an inadequate volume of blood received in culture bottles   Culture   Final    NO GROWTH 5 DAYS Performed at [facility], [address] [place], [place] [address]    Report Status 11/21/2017 FINAL  Final  Respiratory Panel by PCR     Status: None   Collection Time: 11/17/17  8:41 AM  Result Value Ref Range Status   Adenovirus NOT DETECTED NOT DETECTED Final   Coronavirus 229E NOT DETECTED NOT DETECTED Final   Coronavirus HKU1 NOT DETECTED NOT DETECTED Final   Coronavirus NL63 NOT DETECTED NOT DETECTED Final   Coronavirus OC43 NOT DETECTED NOT DETECTED Final   Metapneumovirus NOT DETECTED NOT DETECTED Final   Rhinovirus / Enterovirus NOT DETECTED NOT DETECTED Final   Influenza A NOT DETECTED NOT DETECTED Final   Influenza B NOT DETECTED NOT DETECTED Final   Parainfluenza Virus 1 NOT DETECTED NOT DETECTED Final   Parainfluenza Virus 2 NOT DETECTED NOT DETECTED Final   Parainfluenza Virus 3 NOT DETECTED NOT DETECTED Final   Parainfluenza Virus 4 NOT DETECTED NOT DETECTED Final   Respiratory Syncytial Virus NOT DETECTED NOT DETECTED Final   Bordetella pertussis NOT DETECTED NOT DETECTED Final   Chlamydophila pneumoniae NOT DETECTED NOT DETECTED Final   Mycoplasma pneumoniae NOT DETECTED NOT DETECTED Final    Comment: Performed at [facility], [address], [place], [place] [address]  MRSA PCR Screening     Status: None   Collection Time: 11/17/17  6:54 PM  Result Value Ref Range Status   MRSA by PCR NEGATIVE NEGATIVE Final    Comment:        The GeneXpert MRSA Assay (FDA approved for NASAL specimens only), is one component  of a comprehensive MRSA colonization surveillance program. It is not intended to diagnose MRSA infection nor to guide or monitor treatment for MRSA infections. Performed at [facility], [address] [place], [place] [address]   CSF culture     Status: None (Preliminary result)   Collection Time: 11/22/17  9:28 AM  Result Value Ref Range Status   Specimen Description CSF  Final   Special Requests NONE  Final   Gram Stain   Final  NO ORGANISMS SEEN RARE RED BLOOD CELLS FEW WBC SEEN Performed at [facility], [address] [place], [place] [address]    Culture PENDING  Incomplete   Report Status PENDING  Incomplete    Coagulation Studies: No results for input(s): LABPROT, INR in the last 72 hours.  Imaging: Dg Chest Port 1 View  Result Date: 11/20/2017 CLINICAL DATA:  SOB per physician order. Pt admitted 11/16/2017 for sepsis and altered mental status likely metabolic encephalopathy. Hx - HTN, dementia, acute on chronic renal insufficiency, former smoker. EXAM: PORTABLE CHEST 1 VIEW COMPARISON:  11/17/2017 FINDINGS: Cardiac silhouette is normal in size. No mediastinal or hilar masses. No evidence of adenopathy. There are prominent bronchovascular markings accentuated by low lung volumes. No evidence of pneumonia or pulmonary edema. No pleural effusion or pneumothorax. Skeletal structures are demineralized but grossly intact IMPRESSION: No active disease. Electronically Signed   By: Amie Portlandavid  Ormond M.D.   On: 11/20/2017 14:46   Dg Fluoro Guided Loc Of Needle/cath Tip For Spinal Inject Lt  Result Date: 11/22/2017 CLINICAL DATA:  Confusion EXAM: DIAGNOSTIC LUMBAR PUNCTURE UNDER FLUOROSCOPIC GUIDANCE FLUOROSCOPY TIME:  Fluoroscopy Time:  0.4 minute Radiation Exposure Index (if provided by the fluoroscopic device): 5.9 mGy Number of Acquired Spot Images: 0 PROCEDURE: Informed consent was obtained from the patient prior to the procedure, including potential  complications of headache, allergy, and pain. With the patient prone, the lower back was prepped with Betadine. 1% Lidocaine was used for local anesthesia. Lumbar puncture was performed at the L3-4 level using a 22 gauge needle with return of clear CSF. 9 ml of CSF were obtained for laboratory studies. The patient tolerated the procedure well and there were no apparent complications. IMPRESSION: Successful fluoroscopic guided lumbar puncture. Electronically Signed   By: Elige KoHetal  Patel   On: 11/22/2017 09:47    Medications:  I have reviewed the patient's current medications. Scheduled: . aspirin EC  81 mg Oral Daily  . [START ON 11/23/2017] enoxaparin (LOVENOX) injection  40 mg Subcutaneous Q24H  . feeding supplement (ENSURE ENLIVE)  237 mL Oral BID BM  . insulin aspart  0-5 Units Subcutaneous QHS  . insulin aspart  0-9 Units Subcutaneous TID WC  . insulin aspart  3 Units Subcutaneous TID WC  . ipratropium-albuterol  3 mL Nebulization BID  . memantine  10 mg Oral BID  . methylPREDNISolone (SOLU-MEDROL) injection  60 mg Intravenous Q6H  . modafinil  200 mg Oral Daily  . multivitamin with minerals   Oral Daily  . nystatin  5 mL Mouth/Throat QID  . scopolamine  1 patch Transdermal Q72H    Assessment/Plan: No further seizures.  Mental status somewhat improved.  Off Keppra.  Depakote level of 41.  LP performed.  Protein and glucose mildly elevated but unremarkable.  Cell count pending.    Recommendations: 1.  Would continue Depakote at current dose.  Repeat level in AM. 2.  PT to attempt to get patient up.   3.  Once medically reasonable wold start to taper steroids   LOS: 6 days   Thana FarrLeslie Alixandrea Milleson, MD Neurology [phone] 11/22/2017  11:02 AM

## 2017-11-22 NOTE — Progress Notes (Signed)
Anticoagulation monitoring(Lovenox):  77yo  female ordered Lovenox 40 mg Q12h for DVT prevention.   Filed Weights   11/16/17 1639 11/19/17 1814  Weight: 250 lb (113.4 kg) 228 lb 2 oz (103.5 kg)   BMI 37   Lab Results  Component Value Date   CREATININE 1.08 (H) 11/22/2017   CREATININE 1.05 (H) 11/21/2017   CREATININE 1.06 (H) 11/18/2017   Estimated Creatinine Clearance: 54.5 mL/min (A) (by C-G formula based on SCr of 1.08 mg/dL (H)). Hemoglobin & Hematocrit     Component Value Date/Time   HGB 11.4 (L) 11/22/2017 0509   HCT 33.1 (L) 11/22/2017 0509   Patient with decrease in weight since admission, BMI is now below 40.  Per Protocol for Patient with estCrcl < 30 ml/min and BMI < 40, will transition to Lovenox 40 mg Q24h.     Clovia CuffLisa Averie Meiner, PharmD, BCPS 11/22/2017 9:57 AM

## 2017-11-23 ENCOUNTER — Inpatient Hospital Stay: Payer: Medicare (Managed Care)

## 2017-11-23 LAB — VDRL, CSF: VDRL Quant, CSF: NONREACTIVE

## 2017-11-23 LAB — GLUCOSE, CAPILLARY
GLUCOSE-CAPILLARY: 161 mg/dL — AB (ref 70–99)
GLUCOSE-CAPILLARY: 161 mg/dL — AB (ref 70–99)
Glucose-Capillary: 167 mg/dL — ABNORMAL HIGH (ref 70–99)
Glucose-Capillary: 149 mg/dL — ABNORMAL HIGH (ref 70–99)

## 2017-11-23 LAB — VALPROIC ACID LEVEL: Valproic Acid Lvl: 61 ug/mL (ref 50.0–100.0)

## 2017-11-23 MED ORDER — KETOROLAC TROMETHAMINE 30 MG/ML IJ SOLN
15.0000 mg | Freq: Four times a day (QID) | INTRAMUSCULAR | Status: DC | PRN
  Administered 2017-11-23 – 2017-11-26 (×3): 15 mg via INTRAVENOUS
  Filled 2017-11-23 (×3): qty 1

## 2017-11-23 NOTE — Plan of Care (Signed)

## 2017-11-23 NOTE — Progress Notes (Signed)
[employer] - [place] [facility]                                                                                                                                                                                  Patient Demographics   Sheena Mitchell, is a 77 y.o. female, DOB - Oct 05, 1940, GNF:[id number]  Admit date - 11/16/2017   Admitting Physician Altamese Dilling, MD  Outpatient Primary MD for the patient is [facility]   LOS - 7  Subjective: Patient more awake today able to follow commands  Review of Systems:   CONSTITUTIONAL: Sleepy unable to provide Vitals:   Vitals:   11/22/17 1939 11/22/17 1948 11/23/17 0547 11/23/17 0736  BP:  138/85 (!) 145/73   Pulse:  80 76   Resp:  18 20   Temp:  98.4 F (36.9 C) 97.6 F (36.4 C)   TempSrc:  Oral Oral   SpO2: 98% 100% 97% 98%  Weight:      Height:        Wt Readings from Last 3 Encounters:  11/19/17 103.5 kg (228 lb 2 oz)  10/09/15 99.8 kg (220 lb)  10/02/15 99.8 kg (220 lb)     Intake/Output Summary (Last 24 hours) at 11/23/2017 1249 Last data filed at 11/23/2017 1244 Gross per 24 hour  Intake 1299.17 ml  Output 650 ml  Net 649.17 ml    Physical Exam:   GENERAL: Critically ill  HEAD, EYES, EARS, NOSE AND THROAT: Atraumatic, normocephalic. . Pupils equal and reactive to light. Sclerae anicteric. No conjunctival injection. No oro-pharyngeal erythema.  NECK: Supple. There is no jugular venous distention. No bruits, no lymphadenopathy, no thyromegaly.  HEART: Regular rate and rhythm,. No murmurs, no rubs, no clicks.  LUNGS: Bilateral wheezing throughout both lung no accessory muscle usage ABDOMEN: Soft, flat, nontender, nondistended. Has good bowel sounds. No hepatosplenomegaly appreciated.  EXTREMITIES: No evidence of any cyanosis, clubbing, or peripheral edema.  +2 pedal and radial pulses bilaterally.  NEUROLOGIC: Able to communicate sKIN: Moist and warm with no rashes  appreciated.  Psych: more awake LN: No inguinal LN enlargement    Antibiotics   Anti-infectives (From admission, onward)   Start     Dose/Rate Route Frequency Ordered Stop   11/19/17 1000  doxycycline (VIBRAMYCIN) 100 mg in sodium chloride 0.9 % 250 mL IVPB     100 mg 125 mL/hr over 120 Minutes Intravenous Every 12 hours 11/19/17 0850     11/18/17 1600  Ampicillin-Sulbactam (UNASYN) 3 g in sodium chloride 0.9 % 100 mL IVPB  Status:  Discontinued     3 g 200 mL/hr over 30 Minutes Intravenous Every 6  hours 11/18/17 1552 11/19/17 0850   11/17/17 2200  vancomycin (VANCOCIN) IVPB 1000 mg/200 mL premix  Status:  Discontinued     1,000 mg 200 mL/hr over 60 Minutes Intravenous Every 18 hours 11/17/17 2002 11/17/17 2106   11/17/17 1100  piperacillin-tazobactam (ZOSYN) IVPB 3.375 g  Status:  Discontinued     3.375 g 12.5 mL/hr over 240 Minutes Intravenous Every 8 hours 11/17/17 0832 11/18/17 1046   11/17/17 1000  vancomycin (VANCOCIN) 1,250 mg in sodium chloride 0.9 % 250 mL IVPB  Status:  Discontinued     1,250 mg 166.7 mL/hr over 90 Minutes Intravenous Every 24 hours 11/17/17 0832 11/17/17 1432   11/16/17 1715  piperacillin-tazobactam (ZOSYN) IVPB 3.375 g     3.375 g 100 mL/hr over 30 Minutes Intravenous  Once 11/16/17 1701 11/16/17 1750   11/16/17 1715  vancomycin (VANCOCIN) IVPB 1000 mg/200 mL premix     1,000 mg 200 mL/hr over 60 Minutes Intravenous  Once 11/16/17 1701 11/16/17 1900      Medications   Scheduled Meds: . aspirin EC  81 mg Oral Daily  . enoxaparin (LOVENOX) injection  40 mg Subcutaneous Q24H  . feeding supplement (ENSURE ENLIVE)  237 mL Oral BID BM  . insulin aspart  0-5 Units Subcutaneous QHS  . insulin aspart  0-9 Units Subcutaneous TID WC  . insulin aspart  3 Units Subcutaneous TID WC  . ipratropium-albuterol  3 mL Nebulization BID  . memantine  10 mg Oral BID  . methylPREDNISolone (SOLU-MEDROL) injection  60 mg Intravenous Q6H  . modafinil  200 mg Oral Daily   . multivitamin with minerals   Oral Daily  . nystatin  5 mL Mouth/Throat QID  . scopolamine  1 patch Transdermal Q72H   Continuous Infusions: . dextrose 5 % with KCl 20 mEq / L 20 mEq (11/23/17 0458)  . doxycycline (VIBRAMYCIN) IV 100 mg (11/23/17 1126)  . lactated ringers 50 mL/hr at 11/19/17 1123  . valproate sodium Stopped (11/23/17 1244)   PRN Meds:.acetaminophen, hydrALAZINE, ipratropium-albuterol   Data Review:   Micro Results Recent Results (from the past 240 hour(s))  Urine culture     Status: None   Collection Time: 11/16/17  4:48 PM  Result Value Ref Range Status   Specimen Description   Final    URINE, RANDOM Performed at [facility], [address] [place], [place] [address]    Special Requests   Final    NONE Performed at [facility], [address], [place], [place] [address]    Culture   Final    NO GROWTH Performed at [facility], [address], [place], [place] [address]    Report Status 11/17/2017 FINAL  Final  Culture, blood (Routine x 2)     Status: None   Collection Time: 11/16/17  4:57 PM  Result Value Ref Range Status   Specimen Description BLOOD LEFT FOREARM  Final   Special Requests   Final    BOTTLES DRAWN AEROBIC AND ANAEROBIC Blood Culture results may not be optimal due to an inadequate volume of blood received in culture bottles   Culture   Final    NO GROWTH 5 DAYS Performed at [facility], [address] [place], [place] [address]    Report Status 11/21/2017 FINAL  Final  Culture, blood (Routine x 2)     Status: None   Collection Time: 11/16/17  5:02 PM  Result Value Ref Range Status   Specimen  Description BLOOD RIGHT FOREARM  Final   Special Requests   Final    BOTTLES DRAWN AEROBIC AND ANAEROBIC Blood Culture results may not be optimal due to an inadequate volume of blood received in culture bottles   Culture   Final    NO GROWTH 5 DAYS Performed at [facility], [address] [place], [place] [address]    Report Status 11/21/2017 FINAL  Final  Respiratory Panel by PCR     Status: None   Collection Time: 11/17/17  8:41 AM  Result Value Ref Range Status   Adenovirus NOT DETECTED NOT DETECTED Final   Coronavirus 229E NOT DETECTED NOT DETECTED Final   Coronavirus HKU1 NOT DETECTED NOT DETECTED Final   Coronavirus NL63 NOT DETECTED NOT DETECTED Final   Coron[facility] OC43 NOT [facility] NOT DETECTED Final   Metapneumovirus NOT DETECTED NOT DETECTED Final   Rhinovirus / Enterovirus NOT DETECTED NOT DETECTED Final   Influenza A NOT DETECTED NOT DETECTED Final   Influenza B NOT DETECTED NOT DETECTED Final   Parainfluenza Virus 1 NOT DETECTED NOT DETECTED Final   Parainfluenza Virus 2 NOT DETECTED NOT DETECTED Final   Parainfluenza Virus 3 NOT DETECTED NOT DETECTED Final   Parainfluenza Virus 4 NOT DETECTED NOT DETECTED Final   Respiratory Syncytial Virus NOT DETECTED NOT DETECTED Final   Bordetella pertussis NOT DETECTED NOT DETECTED Final   Chlamydophila pneumoniae NOT DETECTED NOT DETECTED Final   Mycoplasma pneumoniae NOT DETECTED NOT DETECTED Final    Comment: Performed at [facility], [address] [place], [place] [address]  MRSA PCR Screening     Status: None   Collection Time: 11/17/17  6:54 PM  Result Value Ref Range Status   MRSA by PCR NEGATIVE NEGATIVE Final    Comment:        The GeneXpert MRSA Assay (FDA approved for NASAL specimens only), is one component of a comprehensive MRSA colonization surveillance program. It is not intended to diagnose MRSA infection nor to guide or monitor treatment for MRSA infections. Performed at [facility], [address] [place], [place] [address]   CSF culture     Status: None (Preliminary result)   Collection Time: 11/22/17  9:28 AM  Result Value Ref Range Status   Specimen Description   Final    CSF Performed at [facility], [address] [place], [place] [address]     Special Requests   Final    NONE Performed at [facility], [address] [place], [place] [address]    Gram Stain   Final    NO ORGANISMS SEEN RARE RED BLOOD CELLS FEW WBC SEEN Performed at [facility], [address] [place], [place] [address]    Culture   Final    NO GROWTH < 24 HOURS Performed at [facility], [address] [place], [place] [address]    Report Status PENDING  Incomplete    Radiology Reports Ct Head Wo Contrast  Result Date: 11/23/2017 CLINICAL DATA:  Confusion EXAM: CT HEAD WITHOUT CONTRAST TECHNIQUE: Contiguous axial images were obtained from the base of the skull through the vertex without intravenous contrast. COMPARISON:  MRI 11/18/2017.  CT 11/17/2017. FINDINGS: Brain: There is atrophy and chronic small vessel disease changes. No acute intracranial abnormality. Specifically, no hemorrhage, hydrocephalus, mass lesion, acute infarction, or significant intracranial injury. Dural calcifications noted along the tentorium and falx. Vascular: No hyperdense vessel or unexpected calcification. Skull: No acute  calvarial abnormality. Sinuses/Orbits: Visualized paranasal sinuses and mastoids clear. Orbital soft tissues unremarkable. Other: None IMPRESSION: No acute intracranial abnormality. Atrophy, chronic microvascular disease. Electronically Signed   By: Charlett Nose M.D.   On: 11/23/2017 09:17   Ct Head Wo Contrast  Result Date: 11/17/2017 CLINICAL DATA:  77 y/o F; acute mental status change. Fever and suspected seizure. EXAM: CT HEAD WITHOUT CONTRAST TECHNIQUE: Contiguous axial images were obtained from the base of the skull through the vertex without intravenous contrast. COMPARISON:  11/16/2017 CT head FINDINGS: Brain: No evidence of acute infarction, hemorrhage, hydrocephalus, extra-axial collection or mass lesion/mass effect. Stable chronic microvascular ischemic changes and parenchymal volume loss of the brain. Vascular: Calcific  atherosclerosis of carotid siphons. No hyperdense vessel. Skull: Normal. Negative for fracture or focal lesion. Sinuses/Orbits: No acute finding. Other: None. IMPRESSION: 1. No acute intracranial abnormality identified. 2. Stable chronic microvascular ischemic changes and parenchymal volume loss of the brain. Electronically Signed   By: Mitzi Hansen M.D.   On: 11/17/2017 18:41   Ct Head Wo Contrast  Result Date: 11/16/2017 CLINICAL DATA:  Dementia. Altered mental status and progressive weakness. EXAM: CT HEAD WITHOUT CONTRAST TECHNIQUE: Contiguous axial images were obtained from the base of the skull through the vertex without intravenous contrast. COMPARISON:  08/05/2017 FINDINGS: Brain: Mild age related involutional changes of the brain with chronic minimal small vessel ischemia of periventricular and subcortical white matter. No acute intracranial hemorrhage, mass or midline shift. No large vascular territory infarct. No extra-axial fluid collections. Midline fourth ventricle and basal cisterns without effacement. The brainstem and cerebellum are stable. Calcifications along the tentorium are stable. Vascular: No hyperdense vessel sign. Skull: Intact Sinuses/Orbits: Nonacute Other: None IMPRESSION: Minimal chronic small vessel ischemic disease. No acute intracranial abnormality. Electronically Signed   By: Tollie Eth M.D.   On: 11/16/2017 21:12   Mr Brain Wo Contrast  Result Date: 11/18/2017 CLINICAL DATA:  77 y/o F; found down. History of dementia and hypertension. Sepsis. EXAM: MRI HEAD WITHOUT CONTRAST TECHNIQUE: Multiplanar, multiecho pulse sequences of the brain and surrounding structures were obtained without intravenous contrast. COMPARISON:  11/17/2017 CT head.  10/09/2015 MRI head. FINDINGS: Brain: Extensive motion degradation on multiple sequences. No reduced diffusion to suggest acute or early subacute infarction. Stable nonspecific foci of T2 FLAIR hyperintense signal abnormality  in subcortical and periventricular white matter are compatible with mild chronic microvascular ischemic changes for age. Mild brain parenchymal volume loss. No focal mass effect, extra-axial collection, hydrocephalus, or herniation. Punctate stable focus of susceptibility hypointensity in left cerebellar hemisphere compatible with chronic hemosiderin deposition from microhemorrhage. Vascular: Normal flow voids. Skull and upper cervical spine: Normal marrow signal. Sinuses/Orbits: Right maxillary sinus mucous retention cyst. No significant abnormal signal of mastoid air cells. Orbits are unremarkable. Other: None. IMPRESSION: 1. Motion degraded study. 2. No acute intracranial abnormality identified. 3. Stable mild chronic microvascular ischemic changes and parenchymal volume loss of the brain. Electronically Signed   By: Mitzi Hansen M.D.   On: 11/18/2017 14:33   Dg Chest Port 1 View  Result Date: 11/20/2017 CLINICAL DATA:  SOB per physician order. Pt admitted 11/16/2017 for sepsis and altered mental status likely metabolic encephalopathy. Hx - HTN, dementia, acute on chronic renal insufficiency, former smoker. EXAM: PORTABLE CHEST 1 VIEW COMPARISON:  11/17/2017 FINDINGS: Cardiac silhouette is normal in size. No mediastinal or hilar masses. No evidence of adenopathy. There are prominent bronchovascular markings accentuated by low lung volumes. No evidence of pneumonia or pulmonary edema. No pleural  effusion or pneumothorax. Skeletal structures are demineralized but grossly intact IMPRESSION: No active disease. Electronically Signed   By: Amie Portland M.D.   On: 11/20/2017 14:46   Dg Chest Port 1 View  Result Date: 11/17/2017 CLINICAL DATA:  Fever, unresponsive. EXAM: PORTABLE CHEST 1 VIEW COMPARISON:  Radiograph of November 16, 2017. FINDINGS: The heart size and mediastinal contours are within normal limits. Atherosclerosis of thoracic aorta is noted. Hypoinflation of the lungs is noted with minimal  bibasilar subsegmental atelectasis. The visualized skeletal structures are unremarkable. IMPRESSION: Hypoinflation of lungs with minimal bibasilar subsegmental atelectasis. Aortic Atherosclerosis (ICD10-I70.0). Electronically Signed   By: Lupita Raider, M.D.   On: 11/17/2017 18:07   Dg Chest Port 1 View  Result Date: 11/16/2017 CLINICAL DATA:  77 y/o F; altered mental status. Abnormal breath sounds. Sepsis. EXAM: PORTABLE CHEST 1 VIEW COMPARISON:  None. FINDINGS: Normal cardiac silhouette. Aortic atherosclerosis with calcification. Mild bronchitic changes. No focal consolidation no pleural effusion or pneumothorax. No acute osseous abnormality is evident. IMPRESSION: Mild bronchitic changes. No focal consolidation. Aortic atherosclerosis. Electronically Signed   By: Mitzi Hansen M.D.   On: 11/16/2017 17:27   US Abdomen Limited Ruq  Result Date: 11/16/2017 CLINICAL DATA:  Elevated liver function test with sepsis. EXAM: ULTRASOUND ABDOMEN LIMITED RIGHT UPPER QUADRANT COMPARISON:  None. FINDINGS: Gallbladder: The gallbladder is physiologically distended without focal mural thickening. Gallbladder wall is 2.2 mm in single wall thickness which is within normal limits. There is layering biliary sludge with a mobile 1.9 cm gallstone noted near the neck. No sonographic Murphy sign noted by sonographer. Common bile duct: Diameter: Ranged in size from 6.2 mm approximately the 10.4 mm distally. No choledocholithiasis. Liver: No focal lesion identified. Within normal limits in parenchymal echogenicity. Portal vein is patent on color Doppler imaging with normal direction of blood flow towards the liver. IMPRESSION: Uncomplicated cholelithiasis with biliary sludge. Electronically Signed   By: Tollie Eth M.D.   On: 11/16/2017 19:25   Dg Fluoro Guided Loc Of Needle/cath Tip For Spinal Inject Lt  Result Date: 11/22/2017 CLINICAL DATA:  Confusion EXAM: DIAGNOSTIC LUMBAR PUNCTURE UNDER FLUOROSCOPIC GUIDANCE  FLUOROSCOPY TIME:  Fluoroscopy Time:  0.4 minute Radiation Exposure Index (if provided by the fluoroscopic device): 5.9 mGy Number of Acquired Spot Images: 0 PROCEDURE: Informed consent was obtained from the patient prior to the procedure, including potential complications of headache, allergy, and pain. With the patient prone, the lower back was prepped with Betadine. 1% Lidocaine was used for local anesthesia. Lumbar puncture was performed at the L3-4 level using a 22 gauge needle with return of clear CSF. 9 ml of CSF were obtained for laboratory studies. The patient tolerated the procedure well and there were no apparent complications. IMPRESSION: Successful fluoroscopic guided lumbar puncture. Electronically Signed   By: Elige Ko   On: 11/22/2017 09:47     CBC Recent Labs  Lab 11/16/17 1657 11/17/17 0506 11/18/17 0630 11/22/17 0509  WBC 5.2 8.7 8.6 8.9  HGB 12.4 12.8 11.2* 11.4*  HCT 37.1 38.9 33.5* 33.1*  PLT 131* 120* 108* 177  MCV 89.3 91.6 89.8 88.5  MCH 30.0 30.1 30.1 30.4  MCHC 33.6 32.9 33.6 34.4  RDW 15.2* 15.5* 15.7* 15.3*  LYMPHSABS 0.5*  --  0.3*  --   MONOABS 0.4  --  0.5  --   EOSABS 0.0  --  0.0  --   BASOSABS 0.0  --  0.0  --     Chemistries  Recent  Labs  Lab 11/16/17 1657 11/17/17 0506 11/18/17 0630 11/21/17 0744 11/21/17 1227 11/22/17 0509  NA 140 143 139 145  --  142  K 3.9 4.2 3.7 3.4*  --  4.2  CL 108 112* 108 113*  --  112*  CO2 23 22 23 24   --  24  GLUCOSE 91 92 317* 115*  --  217*  BUN 32* 29* 21 26*  --  30*  CREATININE 1.56* 1.45* 1.06* 1.05*  --  1.08*  CALCIUM 8.9 8.7* 8.8* 8.9  --  8.8*  AST 142* 93* 162*  --  126*  --   ALT 316* 240* 246*  --  201*  --   ALKPHOS 158* 135* 320*  --  204*  --   BILITOT 2.9* 2.3* 4.4*  --  1.8*  --    ------------------------------------------------------------------------------------------------------------------ estimated creatinine clearance is 54.5 mL/min (A) (by C-G formula based on SCr of 1.08  mg/dL (H)). ------------------------------------------------------------------------------------------------------------------ No results for input(s): HGBA1C in the last 72 hours. ------------------------------------------------------------------------------------------------------------------ No results for input(s): CHOL, HDL, LDLCALC, TRIG, CHOLHDL, LDLDIRECT in the last 72 hours. ------------------------------------------------------------------------------------------------------------------ No results for input(s): TSH, T4TOTAL, T3FREE, THYROIDAB in the last 72 hours.  Invalid input(s): FREET3 ------------------------------------------------------------------------------------------------------------------ No results for input(s): VITAMINB12, FOLATE, FERRITIN, TIBC, IRON, RETICCTPCT in the last 72 hours.  Coagulation profile Recent Labs  Lab 11/16/17 2246  INR 1.14    No results for input(s): DDIMER in the last 72 hours.  Cardiac Enzymes Recent Labs  Lab 11/16/17 2246  TROPONINI <0.03   ------------------------------------------------------------------------------------------------------------------ Invalid input(s): POCBNP    Assessment & Plan   *Sepsis Fever of unknown origin cultures negative   continue IV doxycycline  CF culture showed no growth  *Altered mental status likely metabolic encephalopathy  MRI without stroke EEG negative Continue seizure medications Continue IV Ammonia level normal  *Seizure continue IV  VPA EEG negative and neurology consult appreciated   *Respiratory distress now improved continue scopolamine Continue nebulizer therapy  *Dementia Continue baseline home medicines when able to take po  *Acute on chronic renal insufficiency resolved  *Elevated LFT Hepatitis panel negative Ultrasound shows no liver abnormality  *Essential hypertension not on any medications continue monitor  *Hyperlipidemia cholesterol medications  discontinued  Patient improving     Code Status Orders  (From admission, onward)        Start     Ordered   11/16/17 2220  Do not attempt resuscitation (DNR)  Continuous    Question Answer Comment  In the event of cardiac or respiratory ARREST Do not call a "code blue"   In the event of cardiac or respiratory ARREST Do not perform Intubation, CPR, defibrillation or ACLS   In the event of cardiac or respiratory ARREST Use medication by any route, position, wound care, and other measures to relive pain and suffering. May use oxygen, suction and manual treatment of airway obstruction as needed for comfort.      11/16/17 2219    Code Status History    This patient has a current code status but no historical code status.           Consults none  DVT Prophylaxis  Lovenox   Lab Results  Component Value Date   PLT 177 11/22/2017     Time Spent in minutes   32 minutes greater than 50% of time spent in care coordination and counseling patient regarding the condition and plan of care.   Auburn Bilberry M.D on 11/23/2017 at 12:49 PM  Between 7am to 6pm - Pager - [phone]  After 6pm go to [email or web address] - Proofreader  [employer]   Office  [phone]

## 2017-11-23 NOTE — Plan of Care (Signed)
  Problem: Education: Goal: Knowledge of General Education information will improve Outcome: Progressing   Problem: Health Behavior/Discharge Planning: Goal: Ability to manage health-related needs will improve Outcome: Progressing   Problem: Clinical Measurements: Goal: Ability to maintain clinical measurements within normal limits will improve Outcome: Progressing Goal: Will remain free from infection Outcome: Progressing Goal: Diagnostic test results will improve Outcome: Progressing Goal: Respiratory complications will improve Outcome: Progressing Goal: Cardiovascular complication will be avoided Outcome: Progressing   Problem: Elimination: Goal: Will not experience complications related to bowel motility Outcome: Progressing Goal: Will not experience complications related to urinary retention Outcome: Progressing   Problem: Pain Managment: Goal: General experience of comfort will improve Outcome: Progressing   Problem: Safety: Goal: Ability to remain free from injury will improve Outcome: Progressing   Problem: Skin Integrity: Goal: Risk for impaired skin integrity will decrease Outcome: Progressing   

## 2017-11-23 NOTE — Progress Notes (Signed)
Subjective: Patient continues to improve slowly.  Today follows some commands.    Objective: Current vital signs: BP (!) 145/73 (BP Location: Left Arm)   Pulse 76   Temp 97.6 F (36.4 C) (Oral)   Resp 20   Ht 5\' 5"  (1.651 m)   Wt 103.5 kg (228 lb 2 oz)   SpO2 98%   BMI 37.96 kg/m  Vital signs in last 24 hours: Temp:  [97.6 F (36.4 C)-98.7 F (37.1 C)] 97.6 F (36.4 C) (07/16 0547) Pulse Rate:  [76-80] 76 (07/16 0547) Resp:  [18-20] 20 (07/16 0547) BP: (138-171)/(73-85) 145/73 (07/16 0547) SpO2:  [97 %-100 %] 98 % (07/16 0736)  Intake/Output from previous day: 07/15 0701 - 07/16 0700 In: 1301.2 [I.V.:966.3; IV Piggyback:334.9] Out: 650 [Urine:650] Intake/Output this shift: No intake/output data recorded. Nutritional status:  Diet Order           Diet NPO time specified Except for: Ice Chips, Sips with Meds  Diet effective now          Neurologic Exam: Mental Status: Eyesopen.Responds when I say good morning and ask how she is doing.  More verbose, at times understandable but at other times not understandable.  Follows simple commands.  Cranial Nerves: II: Discs flat bilaterally;Pupils equal, round, reactive to light and accommodation III,IV,VI: EOMI bilaterally V,VII: corneal reflexpresentbilaterally VIII: grossly intact IX,X: gag reflexunable to be tested, XI: trapezius strength unable to test bilaterally XII: tongue strength unable to test Motor: Moves extremities spontaneously.   Lab Results: Basic Metabolic Panel: Recent Labs  Lab 11/16/17 1657 11/17/17 0506 11/18/17 0630 11/21/17 0744 11/22/17 0509  NA 140 143 139 145 142  K 3.9 4.2 3.7 3.4* 4.2  CL 108 112* 108 113* 112*  CO2 23 22 23 24 24   GLUCOSE 91 92 317* 115* 217*  BUN 32* 29* 21 26* 30*  CREATININE 1.56* 1.45* 1.06* 1.05* 1.08*  CALCIUM 8.9 8.7* 8.8* 8.9 8.8*    Liver Function Tests: Recent Labs  Lab 11/16/17 1657 11/17/17 0506 11/18/17 0630 11/21/17 1227  AST 142*  93* 162* 126*  ALT 316* 240* 246* 201*  ALKPHOS 158* 135* 320* 204*  BILITOT 2.9* 2.3* 4.4* 1.8*  PROT 7.6 7.0 7.0 6.7  ALBUMIN 3.1* 2.8* 2.5* 2.6*   No results for input(s): LIPASE, AMYLASE in the last 168 hours. Recent Labs  Lab 11/21/17 1227  AMMONIA 18    CBC: Recent Labs  Lab 11/16/17 1657 11/17/17 0506 11/18/17 0630 11/22/17 0509  WBC 5.2 8.7 8.6 8.9  NEUTROABS 4.3  --  7.8*  --   HGB 12.4 12.8 11.2* 11.4*  HCT 37.1 38.9 33.5* 33.1*  MCV 89.3 91.6 89.8 88.5  PLT 131* 120* 108* 177    Cardiac Enzymes: Recent Labs  Lab 11/16/17 2246  TROPONINI <0.03    Lipid Panel: No results for input(s): CHOL, TRIG, HDL, CHOLHDL, VLDL, LDLCALC in the last 168 hours.  CBG: Recent Labs  Lab 11/22/17 0755 11/22/17 1129 11/22/17 1639 11/22/17 2139 11/23/17 0735  GLUCAP 171* 148* 153* 164* 161*    Microbiology: Results for orders placed or performed during the hospital encounter of 11/16/17  Urine culture     Status: None   Collection Time: 11/16/17  4:48 PM  Result Value Ref Range Status   Specimen Description   Final    URINE, RANDOM Performed at [facility], [address], [place], [place] [address]    Special Requests   Final    NONE Performed  at [facility], [address] [place], [place] [address]    Culture   Final    NO GROWTH Performed at [facility], [address], [place], [place] [address]    Report Status 11/17/2017 FINAL  Final  Culture, blood (Routine x 2)     Status: None   Collection Time: 11/16/17  4:57 PM  Result Value Ref Range Status   Specimen Description BLOOD LEFT FOREARM  Final   Special Requests   Final    BOTTLES DRAWN AEROBIC AND ANAEROBIC Blood Culture results may not be optimal due to an inadequate volume of blood received in culture bottles   Culture   Final    NO GROWTH 5 DAYS Performed at [facility], [address] [place], [place] [address]    Report Status 11/21/2017 FINAL   Final  Culture, blood (Routine x 2)     Status: None   Collection Time: 11/16/17  5:02 PM  Result Value Ref Range Status   Specimen Description BLOOD RIGHT FOREARM  Final   Special Requests   Final    BOTTLES DRAWN AEROBIC AND ANAEROBIC Blood Culture results may not be optimal due to an inadequate volume of blood received in culture bottles   Culture   Final    NO GROWTH 5 DAYS Performed at [facility], [address] [place], [place] [address]    Report Status 11/21/2017 FINAL  Final  Respiratory Panel by PCR     Status: None   Collection Time: 11/17/17  8:41 AM  Result Value Ref Range Status   Adenovirus NOT DETECTED NOT DETECTED Final   Coronavirus 229E NOT DETECTED NOT DETECTED Final   Coronavirus HKU1 NOT DETECTED NOT DETECTED Final   Coronavirus NL63 NOT DETECTED NOT DETECTED Final   Coronavirus OC43 NOT DETECTED NOT DETECTED Final   Metapneumovirus NOT DETECTED NOT DETECTED Final   Rhinovirus / Enterovirus NOT DETECTED NOT DETECTED Final   Influenza A NOT DETECTED NOT DETECTED Final   Influenza B NOT DETECTED NOT DETECTED Final   Parainfluenza Virus 1 NOT DETECTED NOT DETECTED Final   Parainfluenza Virus 2 NOT DETECTED NOT DETECTED Final   Parainfluenza Virus 3 NOT DETECTED NOT DETECTED Final   Parainfluenza Virus 4 NOT DETECTED NOT DETECTED Final   Respiratory Syncytial Virus NOT DETECTED NOT DETECTED Final   Bordetella pertussis NOT DETECTED NOT DETECTED Final   Chlamydophila pneumoniae NOT DETECTED NOT DETECTED Final   Mycoplasma pneumoniae NOT DETECTED NOT DETECTED Final    Comment: Performed at [facility], [address] [place], [place] [address]  MRSA PCR Screening     Status: None   Collection Time: 11/17/17  6:54 PM  Result Value Ref Range Status   MRSA by PCR NEGATIVE NEGATIVE Final    Comment:        The GeneXpert MRSA Assay (FDA approved for NASAL specimens only), is one component of a comprehensive MRSA colonization surveillance  program. It is not intended to diagnose MRSA infection nor to guide or monitor treatment for MRSA infections. Performed at [facility], [address] [place], [place] [address]   CSF culture     Status: None (Preliminary result)   Collection Time: 11/22/17  9:28 AM  Result Value Ref Range Status   Specimen Description   Final    CSF Performed at [facility], [address], [place], [place] [address]    Special Requests   Final  NONE Performed at [facility], [address] [place], [place] [address]    Gram Stain   Final    NO ORGANISMS SEEN RARE RED BLOOD CELLS FEW WBC SEEN Performed at [facility], [address] [place], [place] [address]    Culture   Final    NO GROWTH < 24 HOURS Performed at [facility], [address] [place], [place] [address]    Report Status PENDING  Incomplete    Coagulation Studies: No results for input(s): LABPROT, INR in the last 72 hours.  Imaging: Ct Head Wo Contrast  Result Date: 11/23/2017 CLINICAL DATA:  Confusion EXAM: CT HEAD WITHOUT CONTRAST TECHNIQUE: Contiguous axial images were obtained from the base of the skull through the vertex without intravenous contrast. COMPARISON:  MRI 11/18/2017.  CT 11/17/2017. FINDINGS: Brain: There is atrophy and chronic small vessel disease changes. No acute intracranial abnormality. Specifically, no hemorrhage, hydrocephalus, mass lesion, acute infarction, or significant intracranial injury. Dural calcifications noted along the tentorium and falx. Vascular: No hyperdense vessel or unexpected calcification. Skull: No acute calvarial abnormality. Sinuses/Orbits: Visualized paranasal sinuses and mastoids clear. Orbital soft tissues unremarkable. Other: None IMPRESSION: No acute intracranial abnormality. Atrophy, chronic microvascular disease. Electronically Signed   By: Charlett NoseKevin  Dover M.D.   On: 11/23/2017 09:17   Dg Fluoro Guided Loc Of Needle/cath Tip  For Spinal Inject Lt  Result Date: 11/22/2017 CLINICAL DATA:  Confusion EXAM: DIAGNOSTIC LUMBAR PUNCTURE UNDER FLUOROSCOPIC GUIDANCE FLUOROSCOPY TIME:  Fluoroscopy Time:  0.4 minute Radiation Exposure Index (if provided by the fluoroscopic device): 5.9 mGy Number of Acquired Spot Images: 0 PROCEDURE: Informed consent was obtained from the patient prior to the procedure, including potential complications of headache, allergy, and pain. With the patient prone, the lower back was prepped with Betadine. 1% Lidocaine was used for local anesthesia. Lumbar puncture was performed at the L3-4 level using a 22 gauge needle with return of clear CSF. 9 ml of CSF were obtained for laboratory studies. The patient tolerated the procedure well and there were no apparent complications. IMPRESSION: Successful fluoroscopic guided lumbar puncture. Electronically Signed   By: Elige KoHetal  Patel   On: 11/22/2017 09:47    Medications:  I have reviewed the patient's current medications. Scheduled: . aspirin EC  81 mg Oral Daily  . enoxaparin (LOVENOX) injection  40 mg Subcutaneous Q24H  . feeding supplement (ENSURE ENLIVE)  237 mL Oral BID BM  . insulin aspart  0-5 Units Subcutaneous QHS  . insulin aspart  0-9 Units Subcutaneous TID WC  . insulin aspart  3 Units Subcutaneous TID WC  . ipratropium-albuterol  3 mL Nebulization BID  . memantine  10 mg Oral BID  . methylPREDNISolone (SOLU-MEDROL) injection  60 mg Intravenous Q6H  . modafinil  200 mg Oral Daily  . multivitamin with minerals   Oral Daily  . nystatin  5 mL Mouth/Throat QID  . scopolamine  1 patch Transdermal Q72H    Assessment/Plan: Patient continues to improve daily.  CSF cell count 2 rbcs and 10 wbcs.  Culture shows no organisms.  CSF infection unlikely.  Depakote level 61.   Recommendations: 1.  Continue Depakote at current dose.  Speech to evaluate today for ability to take po.   2.  Now that following some commands, PT to evaluate.     LOS: 7 days    Thana FarrLeslie Dhruvan Gullion, MD Neurology [phone] [phone] 11/23/2017  10:42 AM

## 2017-11-23 NOTE — Progress Notes (Signed)
Speech Language Pathology Treatment: Dysphagia  Patient Details Name: Sheena Mitchell MRN: [id number] DOB: 21-Nov-1940 Today's Date: 11/23/2017 Time: [id number] SLP Time Calculation (min) (ACUTE ONLY): 54 min  Assessment / Plan / Recommendation Clinical Impression  Pt seen today for ongoing assessment of safe toleration of po's for least restrictive oral diet. Pt has had a new onset of seizures and was transferred to CCU for ~2 days; now transferred out of CCU to regular floor. Pt has been more lethargic but today more awake and verbalized intermittently to others. Sister present in room. Noted pt continues to present w/ min effortful exhalation w/ slight open-mouth posture and lingual forward position.  Pt presented w/ eyes often during the session but did close them often. She required min-mod tactile stimulation of utensil, cup, or straw at lips to attend to the po task. She consumed ~5-6 trials each of puree and NECTAR consistency liquids(using pinched straw at times) w/ no immediate, overt s/s of aspiration; no decline in respiratory status during/post po's. Pt was given more verbal/tactile cues to follow through w/ the swallow and oral clearing b/t trials as pt seemed to hesitate after being fed a bolus. Noted consistent lingual protrusion during the swallowing - this is indicative of reduced neurological function during the swallow. Suspect pt's oropharyngeal phases of swallowing are impacted by her declined Cognitive status in light of new illness. Pt requires full assistance w/ feeding.  Pt is at increased risk for aspiration at this time d/t declined medical and Cognitive status'. Recommend continue w/ dysphagia level 1 diet w/ Nectar liquids w/ strict aspiration precautions and HOLD on any po's if pt is not fully alert/awake and if respiratory status is not calm; HOLD po's if any increased s/s of aspiration noted and inform MD. ST services will continue to follow w/ pt's status w/ toleration of  an oral diet; education w/ family on dysphagia and aspiration precautions; strategies to support/cue pt during feeding. MD/NSG updated; MD agreed w/ initiation of oral diet to help determine pt's ability to meet nutrition/hydration needs orally. Recommend continued Palliative care consult for goals of care w/ family; education w/ family.     HPI HPI: Pt is a 77 y.o. female with a known history of Dementia and hypertension- daughter was her care taker at home.  For last few days daughter had some flulike symptoms as per patient's family members in the room.  Patient also had diarrhea and progressive weakness for last 3 to 4 days.  [facility] and she had a home visiting nurse. Nurse went at home today and nobody opened the door.  So the nurse called police, they entered the house and found patient's daughter dead, and patient laying face down with unresponsive. EMS was called in and they noted patient running high-grade fever and they brought to the emergency room. Initially, pt has been minimally responsive to stimuli but opens eyes; not good verbal communication. Initial work-up for infection is negative. Head CT showed no acute issues. Pt is more awake/alert today to participate in taking po trials during BSE. She verbalized 2-3 single words w/ Brother present in room. He denied pt having any swallowing problems prior to this admission. Pt has had a new onset of Seizures w/ transfer to CCU x2 days. Pt has now moved back onto a regular floor; eyes closed but w/ few verbal responses. Noted increased Respiratory effort at baseline w/ open-mouth posture and tongue forward position.  SLP Plan  Continue with current plan of care       Recommendations  Diet recommendations: Dysphagia 1 (puree);Nectar-thick liquid(conservative w/ liquids) Liquids provided via: Teaspoon;Cup;Straw(monitor) Medication Administration: Crushed with puree Supervision: Staff to assist with self  feeding;Full supervision/cueing for compensatory strategies Compensations: Minimize environmental distractions;Slow rate;Small sips/bites;Lingual sweep for clearance of pocketing;Multiple dry swallows after each bite/sip;Follow solids with liquid Postural Changes and/or Swallow Maneuvers: Seated upright 90 degrees;Upright 30-60 min after meal                General recommendations: (Palliative Care consult) Oral Care Recommendations: Oral care BID;Staff/trained caregiver to provide oral care Follow up Recommendations: (TBD) SLP Visit Diagnosis: Dysphagia, oropharyngeal phase (R13.12) Plan: Continue with current plan of care       GO                Jerilynn Som, MS, CCC-SLP Mitchell,Sheena 11/23/2017, 5:12 PM

## 2017-11-24 LAB — CBC
HCT: 35 % (ref 35.0–47.0)
HEMOGLOBIN: 11.7 g/dL — AB (ref 12.0–16.0)
MCH: 30.2 pg (ref 26.0–34.0)
MCHC: 33.4 g/dL (ref 32.0–36.0)
MCV: 90.7 fL (ref 80.0–100.0)
Platelets: 181 10*3/uL (ref 150–440)
RBC: 3.86 MIL/uL (ref 3.80–5.20)
RDW: 15.1 % — AB (ref 11.5–14.5)
WBC: 12.7 10*3/uL — ABNORMAL HIGH (ref 3.6–11.0)

## 2017-11-24 LAB — BASIC METABOLIC PANEL
Anion gap: 7 (ref 5–15)
BUN: 41 mg/dL — ABNORMAL HIGH (ref 8–23)
CALCIUM: 8.4 mg/dL — AB (ref 8.9–10.3)
CHLORIDE: 110 mmol/L (ref 98–111)
CO2: 19 mmol/L — ABNORMAL LOW (ref 22–32)
CREATININE: 1.43 mg/dL — AB (ref 0.44–1.00)
GFR calc non Af Amer: 35 mL/min — ABNORMAL LOW (ref >60)
GFR, EST AFRICAN AMERICAN: 41 mL/min — AB (ref >60)
GLUCOSE: 197 mg/dL — AB (ref 70–99)
Potassium: 5.2 mmol/L — ABNORMAL HIGH (ref 3.5–5.1)
Sodium: 136 mmol/L (ref 135–145)

## 2017-11-24 LAB — GLUCOSE, CAPILLARY
GLUCOSE-CAPILLARY: 175 mg/dL — AB (ref 70–99)
Glucose-Capillary: 170 mg/dL — ABNORMAL HIGH (ref 70–99)
Glucose-Capillary: 152 mg/dL — ABNORMAL HIGH (ref 70–99)
Glucose-Capillary: 185 mg/dL — ABNORMAL HIGH (ref 70–99)

## 2017-11-24 MED ORDER — ACETAMINOPHEN 325 MG PO TABS: 650.0000 mg | ORAL_TABLET | Freq: Four times a day (QID) | ORAL | Status: DC | PRN

## 2017-11-24 MED ORDER — DEXTROSE 5 % IV SOLN
INTRAVENOUS | Status: DC
  Administered 2017-11-24 – 2017-11-26 (×3): via INTRAVENOUS

## 2017-11-24 MED ORDER — METHYLPREDNISOLONE SODIUM SUCC 125 MG IJ SOLR
60.0000 mg | Freq: Two times a day (BID) | INTRAMUSCULAR | Status: DC
  Administered 2017-11-24 – 2017-11-26 (×4): 60 mg via INTRAVENOUS
  Filled 2017-11-24 (×4): qty 2

## 2017-11-24 NOTE — Progress Notes (Signed)
[employer] - [place] at [facility]                                                                                                                                                                                  Patient Demographics   Sheena Mitchell, is a 77 y.o. female, DOB - November 15, 1940, ZOX:[id number]  Admit date - 11/16/2017   Admitting Physician Altamese Dilling, MD  Outpatient Primary MD for the patient is [facility], [facility]   LOS - 8  Subjective: Patient continues to be awake however unable to communicate coughing with eating  Review of Systems:   CONSTITUTIONAL: Patient awake but confused to provide Vitals:   Vitals:   11/23/17 1925 11/24/17 0512 11/24/17 1000 11/24/17 1420  BP: (!) 145/99 (!) 159/82 (!) 158/88 (!) 178/71  Pulse: 77 76 79 80  Resp: 18 18 20 16   Temp: 98.7 F (37.1 C) 98.9 F (37.2 C)  98.8 F (37.1 C)  TempSrc: Oral Oral  Oral  SpO2: 95% 95% 95% 100%  Weight:      Height:        Wt Readings from Last 3 Encounters:  11/19/17 103.5 kg (228 lb 2 oz)  10/09/15 99.8 kg (220 lb)  10/02/15 99.8 kg (220 lb)     Intake/Output Summary (Last 24 hours) at 11/24/2017 1601 Last data filed at 11/24/2017 1500 Gross per 24 hour  Intake 4010.08 ml  Output 1250 ml  Net 2760.08 ml    Physical Exam:   GENERAL: Chronically ill-appearing  HEAD, EYES, EARS, NOSE AND THROAT: Atraumatic, normocephalic. . Pupils equal and reactive to light. Sclerae anicteric. No conjunctival injection. No oro-pharyngeal erythema.  NECK: Supple. There is no jugular venous distention. No bruits, no lymphadenopathy, no thyromegaly.  HEART: Regular rate and rhythm,. No murmurs, no rubs, no clicks.  LUNGS: Bilateral wheezing throughout both lung no accessory muscle usage ABDOMEN: Soft, flat, nontender, nondistended. Has good bowel sounds. No hepatosplenomegaly appreciated.  EXTREMITIES: No evidence of any cyanosis, clubbing, or peripheral edema.  +2  pedal and radial pulses bilaterally.  NEUROLOGIC: Able to communicate sKIN: Moist and warm with no rashes appreciated.  Psych: more awake but not able to follow commands to well LN: No inguinal LN enlargement    Antibiotics   Anti-infectives (From admission, onward)   Start     Dose/Rate Route Frequency Ordered Stop   11/19/17 1000  doxycycline (VIBRAMYCIN) 100 mg in sodium chloride 0.9 % 250 mL IVPB     100 mg 125 mL/hr over 120 Minutes Intravenous Every 12 hours 11/19/17 0850 11/25/17 2359   11/18/17 1600  Ampicillin-Sulbactam (UNASYN) 3 g in sodium chloride 0.9 % 100  mL IVPB  Status:  Discontinued     3 g 200 mL/hr over 30 Minutes Intravenous Every 6 hours 11/18/17 1552 11/19/17 0850   11/17/17 2200  vancomycin (VANCOCIN) IVPB 1000 mg/200 mL premix  Status:  Discontinued     1,000 mg 200 mL/hr over 60 Minutes Intravenous Every 18 hours 11/17/17 2002 11/17/17 2106   11/17/17 1100  piperacillin-tazobactam (ZOSYN) IVPB 3.375 g  Status:  Discontinued     3.375 g 12.5 mL/hr over 240 Minutes Intravenous Every 8 hours 11/17/17 0832 11/18/17 1046   11/17/17 1000  vancomycin (VANCOCIN) 1,250 mg in sodium chloride 0.9 % 250 mL IVPB  Status:  Discontinued     1,250 mg 166.7 mL/hr over 90 Minutes Intravenous Every 24 hours 11/17/17 0832 11/17/17 1432   11/16/17 1715  piperacillin-tazobactam (ZOSYN) IVPB 3.375 g     3.375 g 100 mL/hr over 30 Minutes Intravenous  Once 11/16/17 1701 11/16/17 1750   11/16/17 1715  vancomycin (VANCOCIN) IVPB 1000 mg/200 mL premix     1,000 mg 200 mL/hr over 60 Minutes Intravenous  Once 11/16/17 1701 11/16/17 1900      Medications   Scheduled Meds: . aspirin EC  81 mg Oral Daily  . enoxaparin (LOVENOX) injection  40 mg Subcutaneous Q24H  . feeding supplement (ENSURE ENLIVE)  237 mL Oral BID BM  . insulin aspart  0-5 Units Subcutaneous QHS  . insulin aspart  0-9 Units Subcutaneous TID WC  . insulin aspart  3 Units Subcutaneous TID WC  .  ipratropium-albuterol  3 mL Nebulization BID  . memantine  10 mg Oral BID  . methylPREDNISolone (SOLU-MEDROL) injection  60 mg Intravenous Q12H  . modafinil  200 mg Oral Daily  . multivitamin with minerals   Oral Daily  . nystatin  5 mL Mouth/Throat QID  . scopolamine  1 patch Transdermal Q72H   Continuous Infusions: . dextrose 75 mL/hr at 11/24/17 1014  . doxycycline (VIBRAMYCIN) IV Stopped (11/24/17 1255)  . lactated ringers 50 mL/hr at 11/19/17 1123  . valproate sodium Stopped (11/24/17 1226)   PRN Meds:.acetaminophen, hydrALAZINE, ipratropium-albuterol, ketorolac   Data Review:   Micro Results Recent Results (from the past 240 hour(s))  Urine culture     Status: None   Collection Time: 11/16/17  4:48 PM  Result Value Ref Range Status   Specimen Description   Final    URINE, RANDOM Performed at [facility], [address] [place], [place] [address]    Special Requests   Final    NONE Performed at [facility], [address], [place], [place] [address]    Culture   Final    NO GROWTH Performed at [facility], [address], [place], [place] [address]    Report Status 11/17/2017 FINAL  Final  Culture, blood (Routine x 2)     Status: None   Collection Time: 11/16/17  4:57 PM  Result Value Ref Range Status   Specimen Description BLOOD LEFT FOREARM  Final   Special Requests   Final    BOTTLES DRAWN AEROBIC AND ANAEROBIC Blood Culture results may not be optimal due to an inadequate volume of blood received in culture bottles   Culture   Final    NO GROWTH 5 DAYS Performed at [facility], [address], [place], [place] [address]    Report Status 11/21/2017 FINAL  Final  Culture, blood (Routine x 2)     Status: None   Collection Time:  11/16/17  5:02 PM  Result Value Ref Range Status   Specimen Description BLOOD RIGHT FOREARM  Final   Special Requests   Final    BOTTLES DRAWN AEROBIC AND ANAEROBIC Blood Culture results may not  be optimal due to an inadequate volume of blood received in culture bottles   Culture   Final    NO GROWTH 5 DAYS Performed at [facility], [address] [place], [place] [address]    Report Status 11/21/2017 FINAL  Final  Respiratory Panel by PCR     Status: None   Collection Time: 11/17/17  8:41 AM  Result Value Ref Range Status   Adenovirus NOT DETECTED NOT DETECTED Final   Coronavirus 229E NOT DETECTED NOT DETECTED Final   Coronavirus HKU1 NOT DETECTED NOT DETECTED Final   Coronavirus NL63 NOT DETECTED NOT DETECTED Final   Coronavirus OC43 NOT DETECTED NOT DETECTED Final   Metapneumovirus NOT DETECTED NOT DETECTED Final   Rhinovirus / Enterovirus NOT DETECTED NOT DETECTED Final   Influenza A NOT DETECTED NOT DETECTED Final   Influenza B NOT DETECTED NOT DETECTED Final   Parainfluenza Virus 1 NOT DETECTED NOT DETECTED Final   Parainfluenza Virus 2 NOT DETECTED NOT DETECTED Final   Parainfluenza Virus 3 NOT DETECTED NOT DETECTED Final   Parainfluenza Virus 4 NOT DETECTED NOT DETECTED Final   Respiratory Syncytial Virus NOT DETECTED NOT DETECTED Final   Bordetella pertussis NOT DETECTED NOT DETECTED Final   Chlamydophila pneumoniae NOT DETECTED NOT DETECTED Final   Mycoplasma pneumoniae NOT DETECTED NOT DETECTED Final    Comment: Performed at [facility], [address] [place], [place] [address]  MRSA PCR Screening     Status: None   Collection Time: 11/17/17  6:54 PM  Result Value Ref Range Status   MRSA by PCR NEGATIVE NEGATIVE Final    Comment:        The GeneXpert MRSA Assay (FDA approved for NASAL specimens only), is one component of a comprehensive MRSA colonization surveillance program. It is not intended to diagnose MRSA infection nor to guide or monitor treatment for MRSA infections. Performed at [facility], [address] [place], [place] [address]   CSF culture     Status: None (Preliminary result)   Collection Time: 11/22/17   9:28 AM  Result Value Ref Range Status   Specimen Description   Final    CSF Performed at [facility], [address] [place], [place] [address]    Special Requests   Final    NONE Performed at [facility], [address] [place], [place] [address]    Gram Stain   Final    NO ORGANISMS SEEN RARE RED BLOOD CELLS FEW WBC SEEN Performed at [facility], [address] [place], [place] [address]    Culture   Final    NO GROWTH 2 DAYS Performed at [facility], [address] [place], [place] [address]    Report Status PENDING  Incomplete  Culture, fungus without smear     Status: None (Preliminary result)   Collection Time: 11/22/17  9:28 AM  Result Value Ref Range Status   Specimen Description   Final    CSF Performed at [facility], [address] [place], [place] [address]    Special Requests   Final    NONE Performed at [facility], [address] [place], [place] [address]    Culture  Final    NO FUNGUS ISOLATED AFTER 2 DAYS Performed at [facility], [address] [place], [place] [address]    Report Status PENDING  Incomplete    Radiology Reports Ct Head Wo Contrast  Result Date: 11/23/2017 CLINICAL DATA:  Confusion EXAM: CT HEAD WITHOUT CONTRAST TECHNIQUE: Contiguous axial images were obtained from the base of the skull through the vertex without intravenous contrast. COMPARISON:  MRI 11/18/2017.  CT 11/17/2017. FINDINGS: Brain: There is atrophy and chronic small vessel disease changes. No acute intracranial abnormality. Specifically, no hemorrhage, hydrocephalus, mass lesion, acute infarction, or significant intracranial injury. Dural calcifications noted along the tentorium and falx. Vascular: No hyperdense vessel or unexpected calcification. Skull: No acute calvarial abnormality. Sinuses/Orbits: Visualized paranasal sinuses and mastoids clear. Orbital soft tissues unremarkable. Other: None  IMPRESSION: No acute intracranial abnormality. Atrophy, chronic microvascular disease. Electronically Signed   By: Charlett Nose M.D.   On: 11/23/2017 09:17   Ct Head Wo Contrast  Result Date: 11/17/2017 CLINICAL DATA:  77 y/o F; acute mental status change. Fever and suspected seizure. EXAM: CT HEAD WITHOUT CONTRAST TECHNIQUE: Contiguous axial images were obtained from the base of the skull through the vertex without intravenous contrast. COMPARISON:  11/16/2017 CT head FINDINGS: Brain: No evidence of acute infarction, hemorrhage, hydrocephalus, extra-axial collection or mass lesion/mass effect. Stable chronic microvascular ischemic changes and parenchymal volume loss of the brain. Vascular: Calcific atherosclerosis of carotid siphons. No hyperdense vessel. Skull: Normal. Negative for fracture or focal lesion. Sinuses/Orbits: No acute finding. Other: None. IMPRESSION: 1. No acute intracranial abnormality identified. 2. Stable chronic microvascular ischemic changes and parenchymal volume loss of the brain. Electronically Signed   By: Mitzi Hansen M.D.   On: 11/17/2017 18:41   Ct Head Wo Contrast  Result Date: 11/16/2017 CLINICAL DATA:  Dementia. Altered mental status and progressive weakness. EXAM: CT HEAD WITHOUT CONTRAST TECHNIQUE: Contiguous axial images were obtained from the base of the skull through the vertex without intravenous contrast. COMPARISON:  08/05/2017 FINDINGS: Brain: Mild age related involutional changes of the brain with chronic minimal small vessel ischemia of periventricular and subcortical white matter. No acute intracranial hemorrhage, mass or midline shift. No large vascular territory infarct. No extra-axial fluid collections. Midline fourth ventricle and basal cisterns without effacement. The brainstem and cerebellum are stable. Calcifications along the tentorium are stable. Vascular: No hyperdense vessel sign. Skull: Intact Sinuses/Orbits: Nonacute Other: None IMPRESSION:  Minimal chronic small vessel ischemic disease. No acute intracranial abnormality. Electronically Signed   By: Tollie Eth M.D.   On: 11/16/2017 21:12   Mr Brain Wo Contrast  Result Date: 11/18/2017 CLINICAL DATA:  77 y/o F; found down. History of dementia and hypertension. Sepsis. EXAM: MRI HEAD WITHOUT CONTRAST TECHNIQUE: Multiplanar, multiecho pulse sequences of the brain and surrounding structures were obtained without intravenous contrast. COMPARISON:  11/17/2017 CT head.  10/09/2015 MRI head. FINDINGS: Brain: Extensive motion degradation on multiple sequences. No reduced diffusion to suggest acute or early subacute infarction. Stable nonspecific foci of T2 FLAIR hyperintense signal abnormality in subcortical and periventricular white matter are compatible with mild chronic microvascular ischemic changes for age. Mild brain parenchymal volume loss. No focal mass effect, extra-axial collection, hydrocephalus, or herniation. Punctate stable focus of susceptibility hypointensity in left cerebellar hemisphere compatible with chronic hemosiderin deposition from microhemorrhage. Vascular: Normal flow voids. Skull and upper cervical spine: Normal marrow signal. Sinuses/Orbits: Right maxillary sinus mucous retention cyst. No significant abnormal signal of mastoid air cells. Orbits are unremarkable. Other: None.  IMPRESSION: 1. Motion degraded study. 2. No acute intracranial abnormality identified. 3. Stable mild chronic microvascular ischemic changes and parenchymal volume loss of the brain. Electronically Signed   By: Mitzi Hansen M.D.   On: 11/18/2017 14:33   Dg Chest Port 1 View  Result Date: 11/20/2017 CLINICAL DATA:  SOB per physician order. Pt admitted 11/16/2017 for sepsis and altered mental status likely metabolic encephalopathy. Hx - HTN, dementia, acute on chronic renal insufficiency, former smoker. EXAM: PORTABLE CHEST 1 VIEW COMPARISON:  11/17/2017 FINDINGS: Cardiac silhouette is normal in  size. No mediastinal or hilar masses. No evidence of adenopathy. There are prominent bronchovascular markings accentuated by low lung volumes. No evidence of pneumonia or pulmonary edema. No pleural effusion or pneumothorax. Skeletal structures are demineralized but grossly intact IMPRESSION: No active disease. Electronically Signed   By: Amie Portland M.D.   On: 11/20/2017 14:46   Dg Chest Port 1 View  Result Date: 11/17/2017 CLINICAL DATA:  Fever, unresponsive. EXAM: PORTABLE CHEST 1 VIEW COMPARISON:  Radiograph of November 16, 2017. FINDINGS: The heart size and mediastinal contours are within normal limits. Atherosclerosis of thoracic aorta is noted. Hypoinflation of the lungs is noted with minimal bibasilar subsegmental atelectasis. The visualized skeletal structures are unremarkable. IMPRESSION: Hypoinflation of lungs with minimal bibasilar subsegmental atelectasis. Aortic Atherosclerosis (ICD10-I70.0). Electronically Signed   By: Lupita Raider, M.D.   On: 11/17/2017 18:07   Dg Chest Port 1 View  Result Date: 11/16/2017 CLINICAL DATA:  77 y/o F; altered mental status. Abnormal breath sounds. Sepsis. EXAM: PORTABLE CHEST 1 VIEW COMPARISON:  None. FINDINGS: Normal cardiac silhouette. Aortic atherosclerosis with calcification. Mild bronchitic changes. No focal consolidation no pleural effusion or pneumothorax. No acute osseous abnormality is evident. IMPRESSION: Mild bronchitic changes. No focal consolidation. Aortic atherosclerosis. Electronically Signed   By: Mitzi Hansen M.D.   On: 11/16/2017 17:27   US Abdomen Limited Ruq  Result Date: 11/16/2017 CLINICAL DATA:  Elevated liver function test with sepsis. EXAM: ULTRASOUND ABDOMEN LIMITED RIGHT UPPER QUADRANT COMPARISON:  None. FINDINGS: Gallbladder: The gallbladder is physiologically distended without focal mural thickening. Gallbladder wall is 2.2 mm in single wall thickness which is within normal limits. There is layering biliary sludge with  a mobile 1.9 cm gallstone noted near the neck. No sonographic Murphy sign noted by sonographer. Common bile duct: Diameter: Ranged in size from 6.2 mm approximately the 10.4 mm distally. No choledocholithiasis. Liver: No focal lesion identified. Within normal limits in parenchymal echogenicity. Portal vein is patent on color Doppler imaging with normal direction of blood flow towards the liver. IMPRESSION: Uncomplicated cholelithiasis with biliary sludge. Electronically Signed   By: Tollie Eth M.D.   On: 11/16/2017 19:25   Dg Fluoro Guided Loc Of Needle/cath Tip For Spinal Inject Lt  Result Date: 11/22/2017 CLINICAL DATA:  Confusion EXAM: DIAGNOSTIC LUMBAR PUNCTURE UNDER FLUOROSCOPIC GUIDANCE FLUOROSCOPY TIME:  Fluoroscopy Time:  0.4 minute Radiation Exposure Index (if provided by the fluoroscopic device): 5.9 mGy Number of Acquired Spot Images: 0 PROCEDURE: Informed consent was obtained from the patient prior to the procedure, including potential complications of headache, allergy, and pain. With the patient prone, the lower back was prepped with Betadine. 1% Lidocaine was used for local anesthesia. Lumbar puncture was performed at the L3-4 level using a 22 gauge needle with return of clear CSF. 9 ml of CSF were obtained for laboratory studies. The patient tolerated the procedure well and there were no apparent complications. IMPRESSION: Successful fluoroscopic guided lumbar puncture. Electronically  Signed   By: Elige Ko   On: 11/22/2017 09:47     CBC Recent Labs  Lab 11/18/17 0630 11/22/17 0509 11/24/17 0423  WBC 8.6 8.9 12.7*  HGB 11.2* 11.4* 11.7*  HCT 33.5* 33.1* 35.0  PLT 108* 177 181  MCV 89.8 88.5 90.7  MCH 30.1 30.4 30.2  MCHC 33.6 34.4 33.4  RDW 15.7* 15.3* 15.1*  LYMPHSABS 0.3*  --   --   MONOABS 0.5  --   --   EOSABS 0.0  --   --   BASOSABS 0.0  --   --     Chemistries  Recent Labs  Lab 11/18/17 0630 11/21/17 0744 11/21/17 1227 11/22/17 0509 11/24/17 0423  NA 139  145  --  142 136  K 3.7 3.4*  --  4.2 5.2*  CL 108 113*  --  112* 110  CO2 23 24  --  24 19*  GLUCOSE 317* 115*  --  217* 197*  BUN 21 26*  --  30* 41*  CREATININE 1.06* 1.05*  --  1.08* 1.43*  CALCIUM 8.8* 8.9  --  8.8* 8.4*  AST 162*  --  126*  --   --   ALT 246*  --  201*  --   --   ALKPHOS 320*  --  204*  --   --   BILITOT 4.4*  --  1.8*  --   --    ------------------------------------------------------------------------------------------------------------------ estimated creatinine clearance is 41.2 mL/min (A) (by C-G formula based on SCr of 1.43 mg/dL (H)). ------------------------------------------------------------------------------------------------------------------ No results for input(s): HGBA1C in the last 72 hours. ------------------------------------------------------------------------------------------------------------------ No results for input(s): CHOL, HDL, LDLCALC, TRIG, CHOLHDL, LDLDIRECT in the last 72 hours. ------------------------------------------------------------------------------------------------------------------ No results for input(s): TSH, T4TOTAL, T3FREE, THYROIDAB in the last 72 hours.  Invalid input(s): FREET3 ------------------------------------------------------------------------------------------------------------------ No results for input(s): VITAMINB12, FOLATE, FERRITIN, TIBC, IRON, RETICCTPCT in the last 72 hours.  Coagulation profile No results for input(s): INR, PROTIME in the last 168 hours.  No results for input(s): DDIMER in the last 72 hours.  Cardiac Enzymes No results for input(s): CKMB, TROPONINI, MYOGLOBIN in the last 168 hours.  Invalid input(s): CK ------------------------------------------------------------------------------------------------------------------ Invalid input(s): POCBNP    Assessment & Plan   *Sepsis Fever of unknown origin cultures negative   continue IV doxycycline for 1 more day CF culture showed  no growth  *Altered mental status likely metabolic encephalopathy  MRI without stroke EEG negative Continue seizure medications Continue IV Ammonia level normal Prognosis very poor  *Seizure continue IV  VPA EEG negative and neurology consult appreciated   *Respiratory distress now improved continue scopolamine Continue nebulizer therapy  *Dementia Continue baseline home medicines when able to take po  *Acute on chronic renal insufficiency resolved  *Elevated LFT Hepatitis panel negative Ultrasound shows no liver abnormality  *Essential hypertension not on any medications continue monitor  *Hyperlipidemia cholesterol medications discontinued  I discussed with the patient and her 3 brothers that her condition is not improving.  And I do not expect her to improve at this point.  She has been here for many days with no improvement.  I recommended comfort care for that her.     Code Status Orders  (From admission, onward)        Start     Ordered   11/16/17 2220  Do not attempt resuscitation (DNR)  Continuous    Question Answer Comment  In the event of cardiac or respiratory ARREST Do not call a "code blue"  In the event of cardiac or respiratory ARREST Do not perform Intubation, CPR, defibrillation or ACLS   In the event of cardiac or respiratory ARREST Use medication by any route, position, wound care, and other measures to relive pain and suffering. May use oxygen, suction and manual treatment of airway obstruction as needed for comfort.      11/16/17 2219    Code Status History    This patient has a current code status but no historical code status.           Consults none  DVT Prophylaxis  Lovenox   Lab Results  Component Value Date   PLT 181 11/24/2017     Time Spent in minutes   32 minutes greater than 50% of time spent in care coordination and counseling patient regarding the condition and plan of care.   Auburn Bilberry M.D on 11/24/2017 at  4:01 PM  Between 7am to 6pm - Pager - [phone]  After 6pm go to [email or web address] - Social research officer, government  [employer]   Office  [phone]

## 2017-11-24 NOTE — Evaluation (Signed)
Physical Therapy Re-Evaluation Patient Details Name: Sheena Mitchell MRN: [id number] DOB: January 24, 1941 Today's Date: 11/24/2017   History of Present Illness  Sheena Mitchell  is a 77 y.o. female with a known history of dementia and hypertension- daughter was her care taker at home. Part of [facility] program, [facility] nurse visted home and found pt face down and daughter dead. Pt had seizre like sx's while in acute care and was trasferred to CCU, pt has been transferred back to floor for continued care.   Clinical Impression  Pt has regressed since last being seen by therapy. Pt is largely non responsive to verbal/visual communication and has difficulty keeping eyes open and maintaining alertness. Pt shows little to no ability to follow simple commands, but is occasionally able to verbalize with grunts or garbles discontent with therapist action. Pt is responsive to pain in UE and LE. Pt has some occasional active movement of RUE without purpose, but is unable to actively contract muscles or to coordinate movement throughout body otherwise. Pt has no sense of balance or midline and can not transfer supine to sit without total assist +2. Would benefit from skilled PT as pt alertness and awareness improves and pt can participate in therapy. Recommend transition to SNF upon discharge from acute hospitalization.    Follow Up Recommendations SNF    Equipment Recommendations       Recommendations for Other Services       Precautions / Restrictions Precautions Precautions: Fall Restrictions Weight Bearing Restrictions: No      Mobility  Bed Mobility Overal bed mobility: Needs Assistance Bed Mobility: Supine to Sit     Supine to sit: +2 for physical assistance;Total assist     General bed mobility comments: Pt is unable to find midline and can not maintain upright posture without total assist.  Transfers                    Ambulation/Gait                Stairs             Wheelchair Mobility    Modified Rankin (Stroke Patients Only)       Balance Overall balance assessment: Needs assistance Sitting-balance support: Bilateral upper extremity supported;Feet supported Sitting balance-Leahy Scale: Zero Sitting balance - Comments: Pt cannot maintain balance and has no awarenss of body position in space while sitting.                                      Pertinent Vitals/Pain      Home Living Family/patient expects to be discharged to:: Private residence Living Arrangements: Other relatives Available Help at Discharge: Home health([facility])           Home Equipment: Walker - standard      Prior Function Level of Independence: Independent with assistive device(s)         Comments: Within home with four wheel walker, pt family reports aid was available 4-8hours 5 days a week for assistance with ADL's     Hand Dominance   Dominant Hand: Right    Extremity/Trunk Assessment   Upper Extremity Assessment Upper Extremity Assessment: Difficult to assess due to impaired cognition(Pt unable to voluntary contract UE muscles or coordinate movement. Exstensor tone seems to be present in RUE, but patient is unable to actively hold or intiate movements. Pt occassionally will move RUE  without cueing for repositioning. )    Lower Extremity Assessment Lower Extremity Assessment: Difficult to assess due to impaired cognition(Pt unable to voluntary contract LE muscles or coordinate movement. Pt has some active movments in LE's wiith wiithdrawl from painful stimuli. )    Cervical / Trunk Assessment Cervical / Trunk Exceptions: Pt is unable to actvily maintian trunk position in sitting, and is total assist to maintain upright position.   Communication   Communication: Other (comment)(Non communicative occasional mumbled and garbled speech)  Cognition Arousal/Alertness: Lethargic Behavior During Therapy: Agitated;Flat affect Overall  Cognitive Status: Difficult to assess Area of Impairment: Attention;Awareness;Following commands;Rancho level               Rancho Levels of Cognitive Functioning Rancho Los Amigos Scales of Cognitive Functioning: Localized response Orientation Level: Disoriented to;Place;Time;Situation;Person             General Comments: Pt shows little to no ability to follow simple commands, but is occasionally able to verbalize with grunts or garbles discontent with therapist action.      General Comments      Exercises Other Exercises Other Exercises: BUE: Passive ROM shoulder flexion, abduction; Passive ROM bilat wrists flex/ext BLE: Passive ROM hip abduction, adduction; knee flexion; ankle plantar/dorsiflexion Other Exercises: Bed mobility: Supine to sit 2+ total assist;    Assessment/Plan    PT Assessment Patient needs continued PT services  PT Problem List Decreased strength;Decreased balance;Decreased cognition;Decreased mobility;Decreased coordination;Decreased range of motion;Decreased activity tolerance;Impaired sensation       PT Treatment Interventions Gait training;Therapeutic activities;Therapeutic exercise;Wheelchair mobility training;Balance training;Patient/family education;Functional mobility training    PT Goals (Current goals can be found in the Care Plan section)       Frequency Min 2X/week   Barriers to discharge        Co-evaluation               AM-PAC PT "6 Clicks" Daily Activity  Outcome Measure Difficulty turning over in bed (including adjusting bedclothes, sheets and blankets)?: Unable Difficulty moving from lying on back to sitting on the side of the bed? : Unable Difficulty sitting down on and standing up from a chair with arms (e.g., wheelchair, bedside commode, etc,.)?: Unable Help needed moving to and from a bed to chair (including a wheelchair)?: Total Help needed walking in hospital room?: Total Help needed climbing 3-5 steps with a  railing? : Total 6 Click Score: 6    End of Session   Activity Tolerance: Treatment limited secondary to medical complications (Comment);Patient limited by fatigue;Treatment limited secondary to agitation;Patient limited by lethargy Patient left: in bed;with bed alarm set;with family/visitor present;with call bell/phone within reach   PT Visit Diagnosis: Muscle weakness (generalized) (M62.81);Difficulty in walking, not elsewhere classified (R26.2);Unsteadiness on feet (R26.81);Other symptoms and signs involving the nervous system (R29.898)    Time: 1050-1120 PT Time Calculation (min) (ACUTE ONLY): 30 min   Charges:   PT Evaluation $PT Re-evaluation: 1 Re-eval PT Treatments $Therapeutic Exercise: 8-22 mins   PT G Codes:        Grayland Jackolby Jamacia Jester, SPT 11/24/17,1:40 PM

## 2017-11-24 NOTE — Progress Notes (Signed)
Subjective: Speech remains unintelligible most of the time.  Seems to be engaging more visually.  Objective: Current vital signs: BP (!) 158/88 (BP Location: Right Arm)   Pulse 79   Temp 98.9 F (37.2 C) (Oral)   Resp 20   Ht 5\' 5"  (1.651 m)   Wt 103.5 kg (228 lb 2 oz)   SpO2 95%   BMI 37.96 kg/m  Vital signs in last 24 hours: Temp:  [98.6 F (37 C)-98.9 F (37.2 C)] 98.9 F (37.2 C) (07/17 0512) Pulse Rate:  [75-79] 79 (07/17 1000) Resp:  [18-20] 20 (07/17 1000) BP: (145-171)/(82-99) 158/88 (07/17 1000) SpO2:  [95 %-97 %] 95 % (07/17 1000)  Intake/Output from previous day: 07/16 0701 - 07/17 0700 In: 685 [IV Piggyback:685] Out: 1250 [Urine:1250] Intake/Output this shift: No intake/output data recorded. Nutritional status:  Diet Order           DIET - DYS 1 Room service appropriate? Yes with Assist; Fluid consistency: Nectar Thick  Diet effective now          Neurologic Exam: Mental Status: Eyesopen.Responds when I say good morning and ask how she is doing.More verbose, at times understandable but at other times not understandable.    Cranial Nerves: II: Discs flat bilaterally;Pupils equal, round, reactive to light and accommodation III,IV,VI: EOMI bilaterally.  Tracking V,VII: corneal reflexpresentbilaterally VIII:grossly intact IX,X: gag reflexunable to be tested, XI: trapezius strength unable to test bilaterally XII: tongue strength unable to test Motor: Moves extremities spontaneously.   Lab Results: Basic Metabolic Panel: Recent Labs  Lab 11/18/17 0630 11/21/17 0744 11/22/17 0509 11/24/17 0423  NA 139 145 142 136  K 3.7 3.4* 4.2 5.2*  CL 108 113* 112* 110  CO2 23 24 24  19*  GLUCOSE 317* 115* 217* 197*  BUN 21 26* 30* 41*  CREATININE 1.06* 1.05* 1.08* 1.43*  CALCIUM 8.8* 8.9 8.8* 8.4*    Liver Function Tests: Recent Labs  Lab 11/18/17 0630 11/21/17 1227  AST 162* 126*  ALT 246* 201*  ALKPHOS 320* 204*  BILITOT 4.4* 1.8*   PROT 7.0 6.7  ALBUMIN 2.5* 2.6*   No results for input(s): LIPASE, AMYLASE in the last 168 hours. Recent Labs  Lab 11/21/17 1227  AMMONIA 18    CBC: Recent Labs  Lab 11/18/17 0630 11/22/17 0509 11/24/17 0423  WBC 8.6 8.9 12.7*  NEUTROABS 7.8*  --   --   HGB 11.2* 11.4* 11.7*  HCT 33.5* 33.1* 35.0  MCV 89.8 88.5 90.7  PLT 108* 177 181    Cardiac Enzymes: No results for input(s): CKTOTAL, CKMB, CKMBINDEX, TROPONINI in the last 168 hours.  Lipid Panel: No results for input(s): CHOL, TRIG, HDL, CHOLHDL, VLDL, LDLCALC in the last 168 hours.  CBG: Recent Labs  Lab 11/23/17 1142 11/23/17 1642 11/23/17 2107 11/24/17 0755 11/24/17 1200  GLUCAP 167* 149* 161* 170* 152*    Microbiology: Results for orders placed or performed during the hospital encounter of 11/16/17  Urine culture     Status: None   Collection Time: 11/16/17  4:48 PM  Result Value Ref Range Status   Specimen Description   Final    URINE, RANDOM Performed at [facility], [address] [place], [place] [address]    Special Requests   Final    NONE Performed at [facility], [address], [place], [place] [address]    Culture   Final    NO GROWTH Performed at [facility], [address] [place], [place] [address]    Report Status 11/17/2017 FINAL  Final  Culture, blood (Routine x 2)     Status: None   Collection Time: 11/16/17  4:57 PM  Result Value Ref Range Status   Specimen Description BLOOD LEFT FOREARM  Final   Special Requests   Final    BOTTLES DRAWN AEROBIC AND ANAEROBIC Blood Culture results may not be optimal due to an inadequate volume of blood received in culture bottles   Culture   Final    NO GROWTH 5 DAYS Performed at [facility], [address] [place], [place] [address]    Report Status 11/21/2017 FINAL  Final  Culture, blood (Routine x 2)     Status: None   Collection Time: 11/16/17  5:02 PM  Result Value Ref Range Status    Specimen Description BLOOD RIGHT FOREARM  Final   Special Requests   Final    BOTTLES DRAWN AEROBIC AND ANAEROBIC Blood Culture results may not be optimal due to an inadequate volume of blood received in culture bottles   Culture   Final    NO GROWTH 5 DAYS Performed at [facility], [address] [place], [place] [address]    Report Status 11/21/2017 FINAL  Final  Respiratory Panel by PCR     Status: None   Collection Time: 11/17/17  8:41 AM  Result Value Ref Range Status   Adenovirus NOT DETECTED NOT DETECTED Final   Coronavirus 229E NOT DETECTED NOT DETECTED Final   Coronavirus HKU1 NOT DETECTED NOT DETECTED Final   Coronavirus NL63 NOT DETECTED NOT DETECTED Final   Coronavirus OC43 NOT DETECTED NOT DETECTED Final   Metapneumovirus NOT DETECTED NOT DETECTED Final   Rhinovirus / Enterovirus NOT DETECTED NOT DETECTED Final   Influenza A NOT DETECTED NOT DETECTED Final   Influenza B NOT DETECTED NOT DETECTED Final   Parainfluenza Virus 1 NOT DETECTED NOT DETECTED Final   Parainfluenza Virus 2 NOT DETECTED NOT DETECTED Final   Parainfluenza Virus 3 NOT DETECTED NOT DETECTED Final   Parainfluenza Virus 4 NOT DETECTED NOT DETECTED Final   Respiratory Syncytial Virus NOT DETECTED NOT DETECTED Final   Bordetella pertussis NOT DETECTED NOT DETECTED Final   Chlamydophila pneumoniae NOT DETECTED NOT DETECTED Final   Mycoplasma pneumoniae NOT DETECTED NOT DETECTED Final    Comment: Performed at [facility], [address], [place], [place] [address]  MRSA PCR Screening     Status: None   Collection Time: 11/17/17  6:54 PM  Result Value Ref Range Status   MRSA by PCR NEGATIVE NEGATIVE Final    Comment:        The GeneXpert MRSA Assay (FDA approved for NASAL specimens only), is one component of a comprehensive MRSA colonization surveillance program. It is not intended to diagnose MRSA infection nor to guide or monitor treatment for MRSA infections. Performed at  [facility], [address] [place], [place] [address]   CSF culture     Status: None (Preliminary result)   Collection Time: 11/22/17  9:28 AM  Result Value Ref Range Status   Specimen Description   Final    CSF Performed at [facility], [address], [place], [place] [address]    Special Requests   Final    NONE Performed at [facility], [address] [place], [place] [address]    Gram Stain   Final    NO ORGANISMS SEEN RARE RED  BLOOD CELLS FEW WBC SEEN Performed at [facility], [address] [place], [place] [address]    Culture   Final    NO GROWTH 2 DAYS Performed at [facility], [address] [place], [place] [address]    Report Status PENDING  Incomplete  Culture, fungus without smear     Status: None (Preliminary result)   Collection Time: 11/22/17  9:28 AM  Result Value Ref Range Status   Specimen Description   Final    CSF Performed at [facility], [address] [place], [place] [address]    Special Requests   Final    NONE Performed at [facility], [address] [place], [place] [address]    Culture   Final    NO FUNGUS ISOLATED AFTER 1 DAY Performed at [facility], [address] [place], [place] [address]    Report Status PENDING  Incomplete    Coagulation Studies: No results for input(s): LABPROT, INR in the last 72 hours.  Imaging: Ct Head Wo Contrast  Result Date: 11/23/2017 CLINICAL DATA:  Confusion EXAM: CT HEAD WITHOUT CONTRAST TECHNIQUE: Contiguous axial images were obtained from the base of the skull through the vertex without intravenous contrast. COMPARISON:  MRI 11/18/2017.  CT 11/17/2017. FINDINGS: Brain: There is atrophy and chronic small vessel disease changes. No acute intracranial abnormality. Specifically, no hemorrhage, hydrocephalus, mass lesion, acute infarction, or significant intracranial injury. Dural calcifications noted along the tentorium  and falx. Vascular: No hyperdense vessel or unexpected calcification. Skull: No acute calvarial abnormality. Sinuses/Orbits: Visualized paranasal sinuses and mastoids clear. Orbital soft tissues unremarkable. Other: None IMPRESSION: No acute intracranial abnormality. Atrophy, chronic microvascular disease. Electronically Signed   By: Charlett NoseKevin  Dover M.D.   On: 11/23/2017 09:17    Medications:  I have reviewed the patient's current medications. Scheduled: . aspirin EC  81 mg Oral Daily  . enoxaparin (LOVENOX) injection  40 mg Subcutaneous Q24H  . feeding supplement (ENSURE ENLIVE)  237 mL Oral BID BM  . insulin aspart  0-5 Units Subcutaneous QHS  . insulin aspart  0-9 Units Subcutaneous TID WC  . insulin aspart  3 Units Subcutaneous TID WC  . ipratropium-albuterol  3 mL Nebulization BID  . memantine  10 mg Oral BID  . methylPREDNISolone (SOLU-MEDROL) injection  60 mg Intravenous Q12H  . modafinil  200 mg Oral Daily  . multivitamin with minerals   Oral Daily  . nystatin  5 mL Mouth/Throat QID  . scopolamine  1 patch Transdermal Q72H    Assessment/Plan: Patient requires max assist for sitting today with therapy.  Was unable to maintain midline.  Unclear how much more improvement patient will have.  Poor likelihood that patient will return to previous level of functioning.    Case discussed with family member in the room.   LOS: 8 days   Thana FarrLeslie Kayelee Herbig, MD Neurology [phone] 11/24/2017  1:32 PM

## 2017-11-24 NOTE — Progress Notes (Signed)
Advanced care plan.  Purpose of the Encounter: Goals of care  Parties in Attendance: Patient herself and her 3 brothers  Patient's Decision Capacity: Not intact  Subjective/Patient's story:  Patient is a 77 year old morbidly obese female with hypertension dementia who was admitted with altered mental status.  Patient has not shown much improvement.  She is unable to communicate now and is aspirating with eating.  Objective/Medical story I discussed with her 3 brothers regarding overall poor prognosis.  I recommended she be made comfort care due to no significant improvement.   Goals of care determination: They would like to have 2 I will asked palliative care team to see other family members involved in her decision.      CODE STATUS: Patient to remain DNR   Time spent discussing advanced care planning: 16 minutes

## 2017-11-24 NOTE — Progress Notes (Signed)
Speech Therapy Note: reviewed chart notes; consulted NSG. NSG reported pt is taking bites and sips of the dysphagia diet but continues to present w/ difficulty w/ oral intake - especially during the oral phase during bolus management for transfer for swallowing. NSG denied any coughing episodes during bites/sips fed to pt.  Pt appears to continue to present w/ declined swallowing function w/ an increased risk for aspiration and inability to meet her nutritional needs safely - suspect this presentation is impacted by her baseline Cognitive decline. Recommend continued f/u w/ Palliative Care for goals of care including discussion option of PEG placement(alternative means of feeding). ST services will continue to f/u for education and toleration of diet w/ pt/family. Recommend continue this dysphagia diet for more safety w/ oral intake; Supervision w/ all oral intake. NSG updated and agreed.     Sheena SomKatherine [name], MS, CCC-SLP

## 2017-11-25 DIAGNOSIS — Z515 Encounter for palliative care: Secondary | ICD-10-CM

## 2017-11-25 DIAGNOSIS — Z66 Do not resuscitate: Secondary | ICD-10-CM

## 2017-11-25 DIAGNOSIS — R627 Adult failure to thrive: Secondary | ICD-10-CM

## 2017-11-25 DIAGNOSIS — G9340 Encephalopathy, unspecified: Secondary | ICD-10-CM

## 2017-11-25 LAB — CSF CULTURE

## 2017-11-25 LAB — GLUCOSE, CAPILLARY
Glucose-Capillary: 137 mg/dL — ABNORMAL HIGH (ref 70–99)
Glucose-Capillary: 136 mg/dL — ABNORMAL HIGH (ref 70–99)
Glucose-Capillary: 119 mg/dL — ABNORMAL HIGH (ref 70–99)
Glucose-Capillary: 122 mg/dL — ABNORMAL HIGH (ref 70–99)

## 2017-11-25 LAB — CSF CULTURE W GRAM STAIN
Culture: NO GROWTH
Gram Stain: NONE SEEN

## 2017-11-25 NOTE — Progress Notes (Signed)
[employer] - [place] at [facility]                                                                                                                                                                                  Patient Demographics   Sheena Mitchell, is a 77 y.o. female, DOB - 1941-03-02, ZOX:[id number]  Admit date - 11/16/2017   Admitting Physician Altamese Dilling, MD  Outpatient Primary MD for the patient is [facility]   LOS - 9  Subjective: Patient continues to not able to follow commands.  Review of Systems:   CONSTITUTIONAL: Patient awake but confused to provide Vitals:   Vitals:   11/24/17 1000 11/24/17 1420 11/24/17 1938 11/25/17 0440  BP: (!) 158/88 (!) 178/71  (!) 101/54  Pulse: 79 80  63  Resp: 20 16  17   Temp:  98.8 F (37.1 C)  98.6 F (37 C)  TempSrc:  Oral  Oral  SpO2: 95% 100% 98% 98%  Weight:      Height:        Wt Readings from Last 3 Encounters:  11/19/17 103.5 kg (228 lb 2 oz)  10/09/15 99.8 kg (220 lb)  10/02/15 99.8 kg (220 lb)     Intake/Output Summary (Last 24 hours) at 11/25/2017 1339 Last data filed at 11/25/2017 1216 Gross per 24 hour  Intake 8311.62 ml  Output 950 ml  Net 7361.62 ml    Physical Exam:   GENERAL: Chronically ill-appearing  HEAD, EYES, EARS, NOSE AND THROAT: Atraumatic, normocephalic. . Pupils equal and reactive to light. Sclerae anicteric. No conjunctival injection. No oro-pharyngeal erythema.  NECK: Supple. There is no jugular venous distention. No bruits, no lymphadenopathy, no thyromegaly.  HEART: Regular rate and rhythm,. No murmurs, no rubs, no clicks.  LUNGS: Bilateral wheezing throughout both lung no accessory muscle usage ABDOMEN: Soft, flat, nontender, nondistended. Has good bowel sounds. No hepatosplenomegaly appreciated.  EXTREMITIES: No evidence of any cyanosis, clubbing, or peripheral edema.  +2 pedal and radial pulses bilaterally.  NEUROLOGIC: Able to  communicate sKIN: Moist and warm with no rashes appreciated.  Psych: more awake but not able to follow commands to well LN: No inguinal LN enlargement    Antibiotics   Anti-infectives (From admission, onward)   Start     Dose/Rate Route Frequency Ordered Stop   11/19/17 1000  doxycycline (VIBRAMYCIN) 100 mg in sodium chloride 0.9 % 250 mL IVPB     100 mg 125 mL/hr over 120 Minutes Intravenous Every 12 hours 11/19/17 0850 11/25/17 2359   11/18/17 1600  Ampicillin-Sulbactam (UNASYN) 3 g in sodium chloride 0.9 % 100 mL IVPB  Status:  Discontinued  3 g 200 mL/hr over 30 Minutes Intravenous Every 6 hours 11/18/17 1552 11/19/17 0850   11/17/17 2200  vancomycin (VANCOCIN) IVPB 1000 mg/200 mL premix  Status:  Discontinued     1,000 mg 200 mL/hr over 60 Minutes Intravenous Every 18 hours 11/17/17 2002 11/17/17 2106   11/17/17 1100  piperacillin-tazobactam (ZOSYN) IVPB 3.375 g  Status:  Discontinued     3.375 g 12.5 mL/hr over 240 Minutes Intravenous Every 8 hours 11/17/17 0832 11/18/17 1046   11/17/17 1000  vancomycin (VANCOCIN) 1,250 mg in sodium chloride 0.9 % 250 mL IVPB  Status:  Discontinued     1,250 mg 166.7 mL/hr over 90 Minutes Intravenous Every 24 hours 11/17/17 0832 11/17/17 1432   11/16/17 1715  piperacillin-tazobactam (ZOSYN) IVPB 3.375 g     3.375 g 100 mL/hr over 30 Minutes Intravenous  Once 11/16/17 1701 11/16/17 1750   11/16/17 1715  vancomycin (VANCOCIN) IVPB 1000 mg/200 mL premix     1,000 mg 200 mL/hr over 60 Minutes Intravenous  Once 11/16/17 1701 11/16/17 1900      Medications   Scheduled Meds: . aspirin EC  81 mg Oral Daily  . enoxaparin (LOVENOX) injection  40 mg Subcutaneous Q24H  . feeding supplement (ENSURE ENLIVE)  237 mL Oral BID BM  . insulin aspart  0-5 Units Subcutaneous QHS  . insulin aspart  0-9 Units Subcutaneous TID WC  . insulin aspart  3 Units Subcutaneous TID WC  . memantine  10 mg Oral BID  . methylPREDNISolone (SOLU-MEDROL) injection  60  mg Intravenous Q12H  . modafinil  200 mg Oral Daily  . multivitamin with minerals   Oral Daily  . nystatin  5 mL Mouth/Throat QID  . scopolamine  1 patch Transdermal Q72H   Continuous Infusions: . dextrose 75 mL/hr at 11/24/17 1014  . doxycycline (VIBRAMYCIN) IV 100 mg (11/25/17 1217)  . lactated ringers Stopped (11/25/17 0500)  . valproate sodium Stopped (11/25/17 1217)   PRN Meds:.acetaminophen, hydrALAZINE, ipratropium-albuterol, ketorolac   Data Review:   Micro Results Recent Results (from the past 240 hour(s))  Urine culture     Status: None   Collection Time: 11/16/17  4:48 PM  Result Value Ref Range Status   Specimen Description   Final    URINE, RANDOM Performed at [facility], [address] [place], [place] [address]    Special Requests   Final    NONE Performed at [facility], [address] [place], [place] [address]    Culture   Final    NO GROWTH Performed at [facility], [address] [place], [place] [address]    Report Status 11/17/2017 FINAL  Final  Culture, blood (Routine x 2)     Status: None   Collection Time: 11/16/17  4:57 PM  Result Value Ref Range Status   Specimen Description BLOOD LEFT FOREARM  Final   Special Requests   Final    BOTTLES DRAWN AEROBIC AND ANAEROBIC Blood Culture results may not be optimal due to an inadequate volume of blood received in culture bottles   Culture   Final    NO GROWTH 5 DAYS Performed at [facility] [address], [place], [place] [address]    Report Status 11/21/2017 FINAL  Final  Culture, blood (Routine x 2)     Status: None   Collection Time: 11/16/17  5:02 PM  Result Value Ref Range Status   Specimen Description BLOOD RIGHT FOREARM  Final  Special Requests   Final    BOTTLES DRAWN AEROBIC AND ANAEROBIC Blood Culture results may not be optimal due to an inadequate volume of blood received in culture bottles   Culture   Final    NO GROWTH 5 DAYS Performed  at [facility], [address] [place], [place] [address]    Report Status 11/21/2017 FINAL  Final  Respiratory Panel by PCR     Status: None   Collection Time: 11/17/17  8:41 AM  Result Value Ref Range Status   Adenovirus NOT DETECTED NOT DETECTED Final   Coronavirus 229E NOT DETECTED NOT DETECTED Final   Coronavirus HKU1 NOT DETECTED NOT DETECTED Final   Coronavirus NL63 NOT DETECTED NOT DETECTED Final   Coronavirus OC43 NOT DETECTED NOT DETECTED Final   Metapneumovirus NOT DETECTED NOT DETECTED Final   Rhinovirus / Enterovirus NOT DETECTED NOT DETECTED Final   Influenza A NOT DETECTED NOT DETECTED Final   Influenza B NOT DETECTED NOT DETECTED Final   Parainfluenza Virus 1 NOT DETECTED NOT DETECTED Final   Parainfluenza Virus 2 NOT DETECTED NOT DETECTED Final   Parainfluenza Virus 3 NOT DETECTED NOT DETECTED Final   Parainfluenza Virus 4 NOT DETECTED NOT DETECTED Final   Respiratory Syncytial Virus NOT DETECTED NOT DETECTED Final   Bordetella pertussis NOT DETECTED NOT DETECTED Final   Chlamydophila pneumoniae NOT DETECTED NOT DETECTED Final   Mycoplasma pneumoniae NOT DETECTED NOT DETECTED Final    Comment: Performed at [facility], [address] [place], [place] [address]  MRSA PCR Screening     Status: None   Collection Time: 11/17/17  6:54 PM  Result Value Ref Range Status   MRSA by PCR NEGATIVE NEGATIVE Final    Comment:        The GeneXpert MRSA Assay (FDA approved for NASAL specimens only), is one component of a comprehensive MRSA colonization surveillance program. It is not intended to diagnose MRSA infection nor to guide or monitor treatment for MRSA infections. Performed at [facility], [address] [place], [place] [address]   CSF culture     Status: None (Preliminary result)   Collection Time: 11/22/17  9:28 AM  Result Value Ref Range Status   Specimen Description   Final    CSF Performed at [facility], [address], [place], [place] [address]    Special Requests   Final    NONE Performed at [facility], [address] [place], [place] [address]    Gram Stain   Final    NO ORGANISMS SEEN RARE RED BLOOD CELLS FEW WBC SEEN Performed at [facility], [address] [place], [place] [address]    Culture   Final    NO GROWTH 3 DAYS Performed at [facility], [address], [place], [place] [address]    Report Status PENDING  Incomplete  Culture, fungus without smear     Status: None (Preliminary result)   Collection Time: 11/22/17  9:28 AM  Result Value Ref Range Status   Specimen Description   Final    CSF Performed at [facility], [address], [place], [place] [address]    Special Requests   Final    NONE Performed at [facility], [address], [place], [place] [address]    Culture   Final    NO FUNGUS ISOLATED AFTER 3 DAYS Performed at [facility], [address] [place],  [place] [address]    Report Status PENDING  Incomplete    Radiology Reports Ct Head Wo Contrast  Result Date: 11/23/2017 CLINICAL DATA:  Confusion EXAM: CT HEAD WITHOUT CONTRAST TECHNIQUE: Contiguous axial images were obtained from the base of the skull through the vertex without intravenous contrast. COMPARISON:  MRI 11/18/2017.  CT 11/17/2017. FINDINGS: Brain: There is atrophy and chronic small vessel disease changes. No acute intracranial abnormality. Specifically, no hemorrhage, hydrocephalus, mass lesion, acute infarction, or significant intracranial injury. Dural calcifications noted along the tentorium and falx. Vascular: No hyperdense vessel or unexpected calcification. Skull: No acute calvarial abnormality. Sinuses/Orbits: Visualized paranasal sinuses and mastoids clear. Orbital soft tissues unremarkable. Other: None IMPRESSION: No acute intracranial abnormality. Atrophy, chronic microvascular disease. Electronically Signed   By: Charlett Nose M.D.   On: 11/23/2017 09:17   Ct Head Wo Contrast  Result Date: 11/17/2017 CLINICAL DATA:  77 y/o F; acute mental status change. Fever and suspected seizure. EXAM: CT HEAD WITHOUT CONTRAST TECHNIQUE: Contiguous axial images were obtained from the base of the skull through the vertex without intravenous contrast. COMPARISON:  11/16/2017 CT head FINDINGS: Brain: No evidence of acute infarction, hemorrhage, hydrocephalus, extra-axial collection or mass lesion/mass effect. Stable chronic microvascular ischemic changes and parenchymal volume loss of the brain. Vascular: Calcific atherosclerosis of carotid siphons. No hyperdense vessel. Skull: Normal. Negative for fracture or focal lesion. Sinuses/Orbits: No acute finding. Other: None. IMPRESSION: 1. No acute intracranial abnormality identified. 2. Stable chronic microvascular ischemic changes and parenchymal volume loss of the brain. Electronically Signed   By: Mitzi Hansen M.D.   On: 11/17/2017 18:41   Ct Head Wo Contrast  Result Date: 11/16/2017 CLINICAL DATA:  Dementia. Altered mental status and progressive weakness. EXAM: CT HEAD WITHOUT CONTRAST TECHNIQUE: Contiguous axial images were obtained from the base of the skull through the vertex without intravenous contrast. COMPARISON:  08/05/2017 FINDINGS: Brain: Mild age related involutional changes of the brain with chronic minimal small vessel ischemia of periventricular and subcortical white matter. No acute intracranial hemorrhage, mass or midline shift. No large vascular territory infarct. No extra-axial fluid collections. Midline fourth ventricle and basal cisterns without effacement. The brainstem and cerebellum are stable. Calcifications along the tentorium are stable. Vascular: No hyperdense vessel sign. Skull: Intact Sinuses/Orbits: Nonacute Other: None IMPRESSION: Minimal chronic small vessel ischemic disease. No acute intracranial abnormality. Electronically Signed   By: Tollie Eth  M.D.   On: 11/16/2017 21:12   Mr Brain Wo Contrast  Result Date: 11/18/2017 CLINICAL DATA:  77 y/o F; found down. History of dementia and hypertension. Sepsis. EXAM: MRI HEAD WITHOUT CONTRAST TECHNIQUE: Multiplanar, multiecho pulse sequences of the brain and surrounding structures were obtained without intravenous contrast. COMPARISON:  11/17/2017 CT head.  10/09/2015 MRI head. FINDINGS: Brain: Extensive motion degradation on multiple sequences. No reduced diffusion to suggest acute or early subacute infarction. Stable nonspecific foci of T2 FLAIR hyperintense signal abnormality in subcortical and periventricular white matter are compatible with mild chronic microvascular ischemic changes for age. Mild brain parenchymal volume loss. No focal mass effect, extra-axial collection, hydrocephalus, or herniation. Punctate stable focus of susceptibility hypointensity in left cerebellar hemisphere compatible with chronic hemosiderin deposition from microhemorrhage. Vascular: Normal flow voids. Skull and upper cervical spine: Normal marrow signal. Sinuses/Orbits: Right maxillary sinus mucous retention cyst. No significant abnormal signal of mastoid air cells. Orbits are unremarkable. Other: None. IMPRESSION: 1. Motion degraded study. 2. No acute intracranial abnormality identified. 3. Stable mild chronic microvascular ischemic changes and parenchymal volume  loss of the brain. Electronically Signed   By: Mitzi HansenLance  Furusawa-Stratton M.D.   On: 11/18/2017 14:33   Dg Chest Port 1 View  Result Date: 11/20/2017 CLINICAL DATA:  SOB per physician order. Pt admitted 11/16/2017 for sepsis and altered mental status likely metabolic encephalopathy. Hx - HTN, dementia, acute on chronic renal insufficiency, former smoker. EXAM: PORTABLE CHEST 1 VIEW COMPARISON:  11/17/2017 FINDINGS: Cardiac silhouette is normal in size. No mediastinal or hilar masses. No evidence of adenopathy. There are prominent bronchovascular markings accentuated  by low lung volumes. No evidence of pneumonia or pulmonary edema. No pleural effusion or pneumothorax. Skeletal structures are demineralized but grossly intact IMPRESSION: No active disease. Electronically Signed   By: Amie Portlandavid  Ormond M.D.   On: 11/20/2017 14:46   Dg Chest Port 1 View  Result Date: 11/17/2017 CLINICAL DATA:  Fever, unresponsive. EXAM: PORTABLE CHEST 1 VIEW COMPARISON:  Radiograph of November 16, 2017. FINDINGS: The heart size and mediastinal contours are within normal limits. Atherosclerosis of thoracic aorta is noted. Hypoinflation of the lungs is noted with minimal bibasilar subsegmental atelectasis. The visualized skeletal structures are unremarkable. IMPRESSION: Hypoinflation of lungs with minimal bibasilar subsegmental atelectasis. Aortic Atherosclerosis (ICD10-I70.0). Electronically Signed   By: Lupita RaiderJames  Green Jr, M.D.   On: 11/17/2017 18:07   Dg Chest Port 1 View  Result Date: 11/16/2017 CLINICAL DATA:  77 y/o F; altered mental status. Abnormal breath sounds. Sepsis. EXAM: PORTABLE CHEST 1 VIEW COMPARISON:  None. FINDINGS: Normal cardiac silhouette. Aortic atherosclerosis with calcification. Mild bronchitic changes. No focal consolidation no pleural effusion or pneumothorax. No acute osseous abnormality is evident. IMPRESSION: Mild bronchitic changes. No focal consolidation. Aortic atherosclerosis. Electronically Signed   By: Mitzi HansenLance  Furusawa-Stratton M.D.   On: 11/16/2017 17:27   [place] Abdomen Limited Ruq  Result Date: 11/16/2017 CLINICAL DATA:  Elevated liver function test with sepsis. EXAM: ULTRASOUND ABDOMEN LIMITED RIGHT UPPER QUADRANT COMPARISON:  None. FINDINGS: Gallbladder: The gallbladder is physiologically distended without focal mural thickening. Gallbladder wall is 2.2 mm in single wall thickness which is within normal limits. There is layering biliary sludge with a mobile 1.9 cm gallstone noted near the neck. No sonographic Murphy sign noted by sonographer. Common bile duct:  Diameter: Ranged in size from 6.2 mm approximately the 10.4 mm distally. No choledocholithiasis. Liver: No focal lesion identified. Within normal limits in parenchymal echogenicity. Portal vein is patent on color Doppler imaging with normal direction of blood flow towards the liver. IMPRESSION: Uncomplicated cholelithiasis with biliary sludge. Electronically Signed   By: Tollie Ethavid  Kwon M.D.   On: 11/16/2017 19:25   Dg Fluoro Guided Loc Of Needle/cath Tip For Spinal Inject Lt  Result Date: 11/22/2017 CLINICAL DATA:  Confusion EXAM: DIAGNOSTIC LUMBAR PUNCTURE UNDER FLUOROSCOPIC GUIDANCE FLUOROSCOPY TIME:  Fluoroscopy Time:  0.4 minute Radiation Exposure Index (if provided by the fluoroscopic device): 5.9 mGy Number of Acquired Spot Images: 0 PROCEDURE: Informed consent was obtained from the patient prior to the procedure, including potential complications of headache, allergy, and pain. With the patient prone, the lower back was prepped with Betadine. 1% Lidocaine was used for local anesthesia. Lumbar puncture was performed at the L3-4 level using a 22 gauge needle with return of clear CSF. 9 ml of CSF were obtained for laboratory studies. The patient tolerated the procedure well and there were no apparent complications. IMPRESSION: Successful fluoroscopic guided lumbar puncture. Electronically Signed   By: Elige KoHetal  Annaliz Aven   On: 11/22/2017 09:47     CBC Recent Labs  Lab  11/22/17 0509 11/24/17 0423  WBC 8.9 12.7*  HGB 11.4* 11.7*  HCT 33.1* 35.0  PLT 177 181  MCV 88.5 90.7  MCH 30.4 30.2  MCHC 34.4 33.4  RDW 15.3* 15.1*    Chemistries  Recent Labs  Lab 11/21/17 0744 11/21/17 1227 11/22/17 0509 11/24/17 0423  NA 145  --  142 136  K 3.4*  --  4.2 5.2*  CL 113*  --  112* 110  CO2 24  --  24 19*  GLUCOSE 115*  --  217* 197*  BUN 26*  --  30* 41*  CREATININE 1.05*  --  1.08* 1.43*  CALCIUM 8.9  --  8.8* 8.4*  AST  --  126*  --   --   ALT  --  201*  --   --   ALKPHOS  --  204*  --   --    BILITOT  --  1.8*  --   --    ------------------------------------------------------------------------------------------------------------------ estimated creatinine clearance is 41.2 mL/min (A) (by C-G formula based on SCr of 1.43 mg/dL (H)). ------------------------------------------------------------------------------------------------------------------ No results for input(s): HGBA1C in the last 72 hours. ------------------------------------------------------------------------------------------------------------------ No results for input(s): CHOL, HDL, LDLCALC, TRIG, CHOLHDL, LDLDIRECT in the last 72 hours. ------------------------------------------------------------------------------------------------------------------ No results for input(s): TSH, T4TOTAL, T3FREE, THYROIDAB in the last 72 hours.  Invalid input(s): FREET3 ------------------------------------------------------------------------------------------------------------------ No results for input(s): VITAMINB12, FOLATE, FERRITIN, TIBC, IRON, RETICCTPCT in the last 72 hours.  Coagulation profile No results for input(s): INR, PROTIME in the last 168 hours.  No results for input(s): DDIMER in the last 72 hours.  Cardiac Enzymes No results for input(s): CKMB, TROPONINI, MYOGLOBIN in the last 168 hours.  Invalid input(s): CK ------------------------------------------------------------------------------------------------------------------ Invalid input(s): POCBNP    Assessment & Plan   *Sepsis Fever of unknown origin cultures negative   Finished IV doxycycline CF culture showed no growth  *Altered mental status likely metabolic encephalopathy  MRI without stroke EEG negative Continue seizure medications Continue IV Ammonia level normal Prognosis very poor  *Seizure continue IV  VPA EEG negative and neurology consult appreciated   *Respiratory distress now improved continue scopolamine Continue nebulizer  therapy  *Dementia Continue baseline home medicines when able to take po  *Acute on chronic renal insufficiency resolved  *Elevated LFT Hepatitis panel negative Ultrasound shows no liver abnormality  *Essential hypertension not on any medications continue monitor  *Hyperlipidemia cholesterol medications discontinued  Further discussions are being held with family regarding making decision regarding comfort care they are deciding whether to have patient go to rehab with hospice and palliative care team or home with hospice     Code Status Orders  (From admission, onward)        Start     Ordered   11/16/17 2220  Do not attempt resuscitation (DNR)  Continuous    Question Answer Comment  In the event of cardiac or respiratory ARREST Do not call a "code blue"   In the event of cardiac or respiratory ARREST Do not perform Intubation, CPR, defibrillation or ACLS   In the event of cardiac or respiratory ARREST Use medication by any route, position, wound care, and other measures to relive pain and suffering. May use oxygen, suction and manual treatment of airway obstruction as needed for comfort.      11/16/17 2219    Code Status History    This patient has a current code status but no historical code status.           Consults none  DVT Prophylaxis  Lovenox   Lab Results  Component Value Date   PLT 181 11/24/2017     Time Spent in minutes   32 minutes greater than 50% of time spent in care coordination and counseling patient regarding the condition and plan of care.   Auburn Bilberry M.D on 11/25/2017 at 1:39 PM  Between 7am to 6pm - Pager - [phone]  After 6pm go to [email or web address] - Social research officer, government  [employer]   Office  [phone]

## 2017-11-25 NOTE — [place] FL2 (Signed)
[place] MEDICAID FL2 LEVEL OF CARE SCREENING TOOL     IDENTIFICATION  Patient Name: Sheena Mitchell Birthdate: [birth year] Sex: female Admission Date (Current Location): 11/16/2017  [place] Number:  Chiropodist and Address:  [facility], [address], [place], [place] [address]      Provider Number: [id number]  Attending Physician Name and Address:  Auburn Bilberry, MD  Relative Name and Phone Number:  Rolland Bimler- brother [phone]    Current Level of Care: Hospital Recommended Level of Care: Skilled Nursing Facility Prior Approval Number:    Date Approved/Denied:   PASRR Number: [id number]  Discharge Plan: SNF    Current Diagnoses: Patient Active Problem List   Diagnosis Date Noted  . Sepsis (HCC) 11/16/2017  . Fever 11/16/2017    Orientation RESPIRATION BLADDER Height & Weight     Self  O2(2 liters ) Incontinent Weight: 228 lb 2 oz (103.5 kg) Height:  5\' 5"  (165.1 cm)  BEHAVIORAL SYMPTOMS/MOOD NEUROLOGICAL BOWEL NUTRITION STATUS  (none) (none) Incontinent Diet(Dysphagia 1)  AMBULATORY STATUS COMMUNICATION OF NEEDS Skin   Extensive Assist Verbally Other (Comment)(Non pressure wound on sacrum)                       Personal Care Assistance Level of Assistance  Bathing, Feeding, Dressing Bathing Assistance: Limited assistance Feeding assistance: Independent Dressing Assistance: Limited assistance     Functional Limitations Info  Sight, Hearing, Speech Sight Info: Adequate Hearing Info: Adequate Speech Info: Adequate    SPECIAL CARE FACTORS FREQUENCY  PT (By licensed PT), OT (By licensed OT)     PT Frequency: 5 OT Frequency: 5            Contractures Contractures Info: Not present    Additional Factors Info  Code Status, Allergies Code Status Info: DNR Allergies Info: NKA           Current Medications (11/25/2017):  This is the current hospital active medication list Current  Facility-Administered Medications  Medication Dose Route Frequency Provider Last Rate Last Dose  . acetaminophen (TYLENOL) tablet 650 mg  650 mg Oral Q6H PRN Auburn Bilberry, MD      . aspirin EC tablet 81 mg  81 mg Oral Daily Altamese Dilling, MD   81 mg at 11/17/17 1003  . dextrose 5 % solution   Intravenous Continuous Auburn Bilberry, MD 75 mL/hr at 11/24/17 1014    . doxycycline (VIBRAMYCIN) 100 mg in sodium chloride 0.9 % 250 mL IVPB  100 mg Intravenous Q12H Auburn Bilberry, MD   Stopped at 11/25/17 0137  . enoxaparin (LOVENOX) injection 40 mg  40 mg Subcutaneous Q24H Auburn Bilberry, MD   40 mg at 11/25/17 1049  . feeding supplement (ENSURE ENLIVE) (ENSURE ENLIVE) liquid 237 mL  237 mL Oral BID BM Auburn Bilberry, MD   237 mL at 11/18/17 1500  . hydrALAZINE (APRESOLINE) injection 10-20 mg  10-20 mg Intravenous Q4H PRN Eugenie Norrie, NP   20 mg at 11/24/17 1431  . insulin aspart (novoLOG) injection 0-5 Units  0-5 Units Subcutaneous QHS Merwyn Katos, MD      . insulin aspart (novoLOG) injection 0-9 Units  0-9 Units Subcutaneous TID WC Merwyn Katos, MD   2 Units at 11/24/17 1802  . insulin aspart (novoLOG) injection 3 Units  3 Units Subcutaneous TID WC Merwyn Katos, MD      . ipratropium-albuterol (DUONEB) 0.5-2.5 (3) MG/3ML nebulizer solution 3 mL  3 mL Nebulization Q6H PRN Auburn BilberryPatel, Shreyang, MD   3 mL at 11/21/17 0627  . ketorolac (TORADOL) 30 MG/ML injection 15 mg  15 mg Intravenous Q6H PRN Auburn BilberryPatel, Shreyang, MD   15 mg at 11/24/17 1818  . lactated ringers infusion   Intravenous Continuous Merwyn KatosSimonds, David B, MD   Stopped at 11/25/17 0500  . memantine (NAMENDA) tablet 10 mg  10 mg Oral BID Altamese DillingVachhani, Vaibhavkumar, MD   Stopped at 11/18/17 2112  . methylPREDNISolone sodium succinate (SOLU-MEDROL) 125 mg/2 mL injection 60 mg  60 mg Intravenous Q12H Auburn BilberryPatel, Shreyang, MD   60 mg at 11/25/17 1048  . modafinil (PROVIGIL) tablet 200 mg  200 mg Oral Daily Auburn BilberryPatel, Shreyang, MD      .  multivitamin with minerals tablet   Oral Daily Altamese DillingVachhani, Vaibhavkumar, MD   1 tablet at 11/17/17 1003  . nystatin (MYCOSTATIN) 100000 UNIT/ML suspension 500,000 Units  5 mL Mouth/Throat QID Auburn BilberryPatel, Shreyang, MD   500,000 Units at 11/25/17 1049  . scopolamine (TRANSDERM-SCOP) 1 MG/3DAYS 1.5 mg  1 patch Transdermal Q72H Auburn BilberryPatel, Shreyang, MD   1.5 mg at 11/23/17 1719  . valproate (DEPACON) 1,000 mg in dextrose 5 % 50 mL IVPB  1,000 mg Intravenous Q12H Thana Farreynolds, Leslie, MD 60 mL/hr at 11/25/17 1049 1,000 mg at 11/25/17 1049     Discharge Medications: Please see discharge summary for a list of discharge medications.  Relevant Imaging Results:  Relevant Lab Results:   Additional Information SSN: [id number]  [name]  [name], [place]

## 2017-11-25 NOTE — Evaluation (Signed)
Occupational Therapy Evaluation Patient Details Name: Sheena Mitchell MRN: [id number] DOB: 09-30-1940 Today's Date: 11/25/2017    History of Present Illness Sheena Mitchell  is a 77 y.o. female with a known history of dementia and hypertension- daughter was her care taker at home. Part of PACE program, PACE nurse visted home and found pt face down and daughter dead. Pt had seizure like symtpoms while in acute care and was trasferred to CCU, pt has been transferred back to floor for continued care.    Clinical Impression   Pt seen for OT evaluation this date d/t presentation with gross weakness and decline in all areas of occupation including the following areas of self care: feeding, grooming, and toileting. Pt family member states she was able to walk with 4WW at home, was able to bathe and dress with some assistance from her daughter who was her primary caregiver. On evaluation, pt with minimal ability to meaningfully follow commands, when given MAX cues, pt has about 20% success. On assessment, pt noted to be resting hands in dependent and digit-flexed position posing risk for contracture development and skin breakdown. OT provided more optimal positioning as well as education to nurse and involved family member about how to optimize UE positioning for best outcomes. Pt most appropriate for SNF on d/c based on today's assessment.     Follow Up Recommendations  SNF    Equipment Recommendations  (TBD, possibly appropriate to defer to next level of care.)    Recommendations for Other Services       Precautions / Restrictions Precautions Precautions: Fall Restrictions Weight Bearing Restrictions: No      Mobility Bed Mobility                  Transfers                      Balance                                           ADL either performed or assessed with clinical judgement   ADL Overall ADL's : Needs assistance/impaired Eating/Feeding:  Maximal assistance;Cueing for safety;Bed level   Grooming: Maximal assistance;Cueing for sequencing;Bed level                                       Vision Baseline Vision/History: Wears glasses Wears Glasses: At all times Patient Visual Report: No change from baseline Additional Comments: vision assessment unable to be completed d/t pt with diffculty following commands, information about vision prior collected from family member.      Perception     Praxis      Pertinent Vitals/Pain Faces Pain Scale: No hurt     Hand Dominance     Extremity/Trunk Assessment Upper Extremity Assessment Upper Extremity Assessment: Difficult to assess due to impaired cognition;RUE deficits/detail;LUE deficits/detail RUE Deficits / Details: Pt attempted initiation of shoulder flexion to reach for OT's hand, required assistance to complete, with attempted PROM of shoulder flexion, tension in shoulder and slight grimmacing. Pt demos ability to grip with hansd 4/5, however, difficulty initiating digit extension and hands rest in flexed position posing risk for skin breakdown.  LUE Deficits / Details: Pt attempted initiation of shoulder flexion to reach for OT's hand, required assistance to complete,  with attempted PROM of shoulder flexion, tension in shoulder and slight grimmacing. Pt demos ability to grip with hansd 4/5, however, difficulty initiating digit extension and hands rest in flexed position posing risk for skin breakdown.   Lower Extremity Assessment Lower Extremity Assessment: Defer to PT evaluation       Communication Communication Communication: Other (comment)(Pt was able to communicate with word finding issues at baseline, speech is now garbled and responses are minimally understandable)   Cognition Arousal/Alertness: Lethargic Behavior During Therapy: Flat affect Overall Cognitive Status: Difficult to assess                 Rancho Levels of Cognitive  Functioning [place] Mirant Scales of Cognitive Functioning: Localized response Orientation Level: Disoriented to;Place;Time;Situation;Person             General Comments: Pt with minimal ability to follow one step commands with MAX tactile, visual and verbal cueing, completes to her best ability 2 of 10 requested actions.   General Comments       Exercises Other Exercises Other Exercises: Pt's family member and involved nursing staff present this shift educated about optimal positioning for pt's UE to prevent contracture and skin breakdown. Parties verbalize understanding and pt's family member present for visual demonstration.    Shoulder Instructions      Home Living Family/patient expects to be discharged to:: Private residence Living Arrangements: Other relatives(daughter) Available Help at Discharge: Home health Type of Home: House Home Access: Stairs to enter [employer] of Steps: 5   Home Layout: One level     Bathroom Shower/Tub: Chief Strategy Officer: Standard     Home Equipment: Environmental consultant - 4 wheels          Prior Functioning/Environment Level of Independence: Independent with assistive device(s)        Comments: Within home with four wheel walker, pt family reports aid was available 4-8hours 5 days a week for assistance with ADL's        OT Problem List: Decreased strength;Decreased range of motion;Decreased activity tolerance;Impaired balance (sitting and/or standing);Decreased coordination;Decreased cognition      OT Treatment/Interventions: Self-care/ADL training;Therapeutic exercise;Neuromuscular education;Splinting;Therapeutic activities;Patient/family education    OT Goals(Current goals can be found in the care plan section) Acute Rehab OT Goals Patient Stated Goal: family states she would like to see pt a little more independent.  OT Goal Formulation: With family Time For Goal Achievement: 12/09/17 Potential to  Achieve Goals: Fair ADL Goals Additional ADL Goal #1: involved caregivers with verbalize understanding of optimal UE positioning (above heart level to allow for decreased swelling) with 100% accuracy. Additional ADL Goal #2: Involved caregivers including family and staff with demonstrate optimal hand positioning with use of towel to encourage digit extension with 75% carryover.  OT Frequency: Min 1X/week   Barriers to D/C:            Co-evaluation              AM-PAC PT "6 Clicks" Daily Activity     Outcome Measure Help from another person eating meals?: A Lot Help from another person taking care of personal grooming?: A Lot Help from another person toileting, which includes using toliet, bedpan, or urinal?: Total Help from another person bathing (including washing, rinsing, drying)?: Total Help from another person to put on and taking off regular upper body clothing?: Total Help from another person to put on and taking off regular lower body clothing?: Total 6 Click Score: 8  End of Session Nurse Communication: Other (comment)(positioning recommendations)  Activity Tolerance: Patient limited by lethargy Patient left: in bed;with bed alarm set;with family/visitor present  OT Visit Diagnosis: Muscle weakness (generalized) (M62.81);Feeding difficulties (R63.3)                Time: [id number] OT Time Calculation (min): 31 min Charges:  OT General Charges $OT Visit: 1 Visit OT Evaluation $OT Eval Moderate Complexity: 1 Mod OT Treatments $Self Care/Home Management : 8-22 mins Rejeana Brocklison Eleonore Shippee, MS, OTR/L ascom [phone] or [phone] 11/25/17, 3:35 PM

## 2017-11-25 NOTE — Clinical Social Work Note (Signed)
CS[facility] [place] [facility] [phone]. Annice PihJackie states that family is now requesting patient go to SNF at discharge and have requested [facility]. CSW will send referral to [facility] and continue to follow for discharge planning.   [name] Erma Raiche MSW, [address] RdCSWA [phone]

## 2017-11-25 NOTE — Progress Notes (Signed)
Initial Nutrition Assessment  DOCUMENTATION CODES:   Obesity unspecified  INTERVENTION:  Will discontinue Ensure Enlive as it is not nectar-thick.  Provide Hormel Shake (Vital Cuisine) po TID with meals, each supplement provides 520 kcal and 22 grams of protein.  Continue daily MVI.  Will monitor for outcome of discussions regarding goals of care, especially regarding PEG tube. As patient has dementia, placement of PEG tube would likely not increase quality or quantity of life. PEG tubes also do not decrease risk of aspiration.  NUTRITION DIAGNOSIS:   Inadequate oral intake related to lethargy/confusion as evidenced by meal completion < 25%, per patient/family report.  GOAL:   Patient will meet greater than or equal to 90% of their needs  MONITOR:   PO intake, Supplement acceptance, Labs, Weight trends, Skin, I & O's  REASON FOR ASSESSMENT:   LOS, Low Braden    ASSESSMENT:   77 year old female with PMHx of dementia, HTN who is admitted with sepsis, AMS likely metabolic encephalopathy, seizure, acute on chronic renal insufficiency now resolved.   -Pending PMT consult for failure to improve and discussion regarding PEG tube. -Per chart MD recommends comfort care for patient and family is deciding on going to facility with hospice or home with hospice.  Met with patient and her daughter at bedside. Patient unable to provide any history. Daughter reports patient has had very poor PO intake during hospital stay. She reports patient typically eats fairly well at home. She denies any previous issues with chewing or swallowing.   UBW around 220 lbs. Daughter reports patient was weight-stable PTA.  Medications reviewed and include: Novolog 0-9 units TID, Novolog 0-5 units QHS, Novolog 3 units TID as meal coverage, Solu-Medrol 60 mg Q12hrs IV, MVI daily, doxycycline, valproate.   Labs reviewed: CBG 137-185, Potassium 5.2, CO2 19, BUN 41, Creatinine 1.43.  Discussed with SLP and  RN. Patient has not been able to take very much PO today.  NUTRITION - FOCUSED PHYSICAL EXAM:   Most Recent Value  Orbital Region  No depletion  Upper Arm Region  No depletion  Thoracic and Lumbar Region  No depletion  Buccal Region  No depletion  Temple Region  Mild depletion  Clavicle Bone Region  No depletion  Clavicle and Acromion Bone Region  No depletion  Scapular Bone Region  No depletion  Dorsal Hand  No depletion  Patellar Region  Unable to assess  Anterior Thigh Region  Unable to assess  Posterior Calf Region  Unable to assess  Edema (RD Assessment)  Mild  Hair  Reviewed  Eyes  Unable to assess  Mouth  Unable to assess  Skin  Reviewed  Nails  Reviewed     Diet Order:   Diet Order           DIET - DYS 1 Room service appropriate? Yes with Assist; Fluid consistency: Nectar Thick  Diet effective now          EDUCATION NEEDS:   Not appropriate for education at this time  Skin:  Skin Assessment: Skin Integrity Issues: Skin Integrity Issues:: Other (Comment) Other: non-pressure wound to medial sacrum  Last BM:  11/24/2017 - medium type 6  Height:   Ht Readings from Last 1 Encounters:  11/19/17 _0  (1.651 m)    Weight:   Wt Readings from Last 1 Encounters:  11/19/17 228 lb 2 oz (103.5 kg)    Ideal Body Weight:  56.8 kg  BMI:  Body mass index is 37.96 kg/m.  Estimated Nutritional Needs:   Kcal:  1740-1885 (MSJ x 1.2-1.3)  Protein:  90-100 grams (0.9-1 grams/kg)  Fluid:  1.8 L/day  Willey Blade, MS, RD, LDN Office: [phone] Pager: [phone] After Hours/Weekend Pager: [phone]

## 2017-11-25 NOTE — Progress Notes (Signed)
Speech Therapy note: reviewed chart notes. Noted Palliative Care note and discussion w/ family focusing on comfort, quality and dignity; once a bed is secured at a SNF, pt will d/t with PACE assistance for ongoing end of life care/Pathway program family desires no life prolonging measures.  Recommend continued use of a modified dysphagia diet to lessen risk for aspiration, discomfort w/ aspiration precautions and support at meals w/ feeding.  ST services will be available for any further education as needed while admitted.     Jerilynn SomKatherine Crytal Pensinger, MS, CCC-SLP

## 2017-11-25 NOTE — Consult Note (Signed)
Consultation Note Date: 11/25/2017   Patient Name: Sheena Mitchell  DOB: Mar 02, 1941  MRN: [id number]  Age / Sex: 77 y.o., female  PCP: [facility] Referring Physician: Auburn Bilberry, MD  Reason for Consultation: Establishing goals of care and Psychosocial/spiritual support  HPI/Patient Profile: 77 y.o. female   admitted on 11/16/2017 with a PMH of dementia and hypertension- daughter was her care taker at home.  For last few days daughter had some flulike symptoms as per patient's family members.  Patient also had diarrhea and progressive weakness.  Home health nurse went to home  and nobody opened the door.  So the nurse called police, they entered the house and found patient's daughter dead, and patient laying face down with unresponsive.  EMS was called in and they noted patient running high-grade fever and they brought to the emergency room.  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   Clinical Assessment and Goals of Care:   This NP Lorinda Creed reviewed medical records, received report from team, assessed the patient and then meet at the patient's bedside along with brother /Dwight/ HPOA and five other siblings and Bishop from their church  to discuss diagnosis, prognosis, GOC, EOL wishes disposition and options.  [facility] representatives involved toward end of meeting discuss their services at this time  Concept of Hospice and Palliative Care were discussed  A detailed discussion was had today regarding advanced directives.  Concepts specific to code status, artifical feeding and hydration, continued IV antibiotics and rehospitalization was  had.  The difference between a aggressive medical intervention path  and a palliative comfort care path for this patient at this time was had.  Values and goals of care important to patient and family were attempted to be elicited.  MOST form introduced  Natural trajectory and expectations at EOL were discussed.  Questions and concerns addressed.   Family encouraged to call with questions or concerns.    PMT will continue to support holistically.    HCPOA    SUMMARY OF RECOMMENDATIONS    Focus of care is comfort, quality and dignity.    Once patient secures a bed at a SNF with [facility] assistance for ongoing end of life care/Pathway program family desires no life prolonging measures.       - DNR/DNI    - no artifical feeding or hydration/ only comfort measures     - no further diagnostics    - minimize medications, no IV antibiotics    - avoid hospitalization  Code Status/Advance Care Planning:  DNR    Palliative Prophylaxis:   Aspiration, Bowel Regimen, Frequent Pain Assessment and Oral Care  Additional Recommendations (Limitations, Scope, Preferences):  Full Comfort Care  Psycho-social/Spiritual:   Desire for further Chaplaincy support:no  Additional Recommendations: Education on Hospice  Prognosis:   < 4 weeks  Discharge Planning:  Primary Diagnoses: Present on Admission: **None**   I have reviewed the medical record, interviewed the patient and family, and examined the patient. The following aspects are pertinent.  Past Medical History:  Diagnosis Date  . Dementia   . Hypertension    Social History   Socioeconomic History  . Marital status: Divorced    Spouse name: Not on file  . Number of children: Not on file  . Years of education: Not on file  . Highest education level: Not on file  Occupational History  . Not on file  Social Needs  . Financial resource strain: Not on file  . Food insecurity:    Worry: Not on file    Inability:  Not on file  . Transportation needs:    Medical: Not on file    Non-medical: Not on file  Tobacco Use  . Smoking status: Former Games developer  . Smokeless tobacco: Never Used  Substance and Sexual Activity  . Alcohol use: No  . Drug use: Not Currently  . Sexual activity: Not on file  Lifestyle  . Physical activity:    Days per week: Not on file    Minutes per session: Not on file  . Stress: Not on file  Relationships  . Social connections:    Talks on phone: Not on file    Gets together: Not on file    Attends religious service: Not on file    Active member of club or organization: Not on file    Attends meetings of clubs or organizations: Not on file    Relationship status: Not on file  Other Topics Concern  . Not on file  Social History Narrative  . Not on file   Family History  Problem Relation Age of Onset  . Colon cancer Mother    Scheduled Meds: . aspirin EC  81 mg Oral Daily  . enoxaparin (LOVENOX) injection  40 mg Subcutaneous Q24H  . feeding supplement (ENSURE ENLIVE)  237 mL Oral BID BM  . insulin aspart  0-5 Units Subcutaneous QHS  . insulin aspart  0-9 Units Subcutaneous TID WC  . insulin aspart  3 Units Subcutaneous TID WC  . memantine  10 mg Oral BID  . methylPREDNISolone (SOLU-MEDROL) injection  60 mg Intravenous Q12H  . modafinil  200 mg Oral Daily  . multivitamin with minerals   Oral Daily  . nystatin  5 mL Mouth/Throat QID  . scopolamine  1 patch Transdermal Q72H   Continuous Infusions: . dextrose 75 mL/hr at 11/24/17 1014  . doxycycline (VIBRAMYCIN) IV Stopped (11/25/17 0137)  . lactated ringers Stopped (11/25/17 0500)  . valproate sodium Stopped (11/24/17 2317)   PRN Meds:.acetaminophen, hydrALAZINE, ipratropium-albuterol, ketorolac Medications Prior to Admission:  Prior to Admission medications   Medication Sig Start Date End Date Taking? Authorizing Provider  aspirin EC 81 MG tablet Take 81 mg by mouth daily.   Yes [provider]    atorvastatin (LIPITOR) 10 MG tablet Take 10 mg by mouth at bedtime.   Yes [provider]  Cholecalciferol (D3-1000) 1000 units tablet Take 1,000 Units by mouth daily.   Yes [provider]  cyanocobalamin 1000 MCG tablet Take 1,000 mcg by mouth daily.   Yes [provider]  galantamine (RAZADYNE) 12 MG tablet Take 12 mg by mouth 2 (two) times daily.   Yes [provider]  memantine (NAMENDA) 10 MG tablet Take 10 mg by mouth 2 (two) times daily.   Yes [provider]  Multiple Vitamins-Minerals (CERTAVITE SENIOR/ANTIOXIDANT) TABS Take 1 tablet by mouth daily.   Yes [provider]  QUEtiapine (SEROQUEL) 25 MG tablet Take 25 mg by mouth daily with supper.   Yes [provider]   No Known Allergies Review of Systems  Unable to perform ROS: Acuity of condition    Physical Exam  Constitutional: She appears well-developed. She appears lethargic. She appears ill. Nasal cannula in place.  Cardiovascular: Normal rate, regular rhythm and normal heart sounds.  Pulmonary/Chest: She has decreased breath sounds.  Musculoskeletal:  generalized weakness  Neurological: She appears lethargic.  Skin: Skin is warm and dry. Urticarial rash:      Vital Signs: BP (!) 101/54 (BP Location: Left Arm)   Pulse 63   Temp 98.6 F (37 C) (Oral)   Resp 17   Ht 5\' 5"  (1.651 m)   Wt 103.5 kg (228 lb 2 oz)   SpO2 98%   BMI 37.96 kg/m  Pain Scale: 0-10   Pain Score: 0-No pain   SpO2: SpO2: 98 % O2 Device:SpO2: 98 % O2 Flow Rate: .O2 Flow Rate (L/min): 2 L/min  IO: Intake/output summary:   Intake/Output Summary (Last 24 hours) at 11/25/2017 0914 Last data filed at 11/25/2017 0600 Gross per 24 hour  Intake 6135.08 ml  Output 950 ml  Net 5185.08 ml    LBM: Last BM Date: 11/24/17(smear) Baseline Weight: Weight: 113.4 kg (250 lb) Most recent weight: Weight: 103.5 kg (228 lb 2 oz)     Palliative Assessment/Data: 20 %    Discussed with  Dr Allena KatzPatel and [facility] respresentatives  Time In: 0800 Time Out: 0915 Time Total: 75 minutees Greater than 50%  of this time was spent counseling and coordinating care related to the above assessment and plan.  Signed by: Lorinda CreedMary Dorota Heinrichs, NP   Please contact Palliative Medicine Team phone at [phone] for questions and concerns.  For individual provider: See Loretha StaplerAmion

## 2017-11-26 LAB — GLUCOSE, CAPILLARY
GLUCOSE-CAPILLARY: 146 mg/dL — AB (ref 70–99)
Glucose-Capillary: 140 mg/dL — ABNORMAL HIGH (ref 70–99)

## 2017-11-26 MED ORDER — LORAZEPAM 0.5 MG PO TABS: 0.5000 mg | ORAL_TABLET | Freq: Three times a day (TID) | ORAL | 0 refills | Status: AC | PRN

## 2017-11-26 MED ORDER — MODAFINIL 200 MG PO TABS: 200.0000 mg | ORAL_TABLET | Freq: Every day | ORAL | Status: AC

## 2017-11-26 MED ORDER — MORPHINE SULFATE 20 MG/5ML PO SOLN: 2.5000 mg | ORAL | 0 refills | Status: AC | PRN

## 2017-11-26 MED ORDER — SCOPOLAMINE 1 MG/3DAYS TD PT72: 1.0000 | MEDICATED_PATCH | TRANSDERMAL | 12 refills | Status: AC

## 2017-11-26 MED ORDER — VALPROIC ACID 250 MG PO CAPS: 1000.0000 mg | ORAL_CAPSULE | Freq: Two times a day (BID) | ORAL | 2 refills | Status: AC

## 2017-11-26 NOTE — Progress Notes (Signed)
Patient discharge to [facility] via EMS. Family is at bedside. Discharge package given to EMS.

## 2017-11-26 NOTE — Clinical Social Work Note (Signed)
Patient is medically ready for discharge today. CSW notified patient and brother Rolland BimlerDwight Haith in room. CSW also left voicemail for Annice PihJackie with [facility] that patient will discharge to [facility] today. CSW notified Tammy, [facility]. Patient will be transported by EMS. RN to call report and call for transport.   [name] Aloys Hupfer MSW, [address] RdCSWA [phone]

## 2017-11-26 NOTE — Progress Notes (Signed)
Report called to Francena HanlyBelinda Peters, RN at [facility], all questions answered and concern addressed.

## 2017-11-26 NOTE — Progress Notes (Signed)
EMS called to transport the patient to [facility]. EMS will be here by 13:00.

## 2017-11-26 NOTE — Discharge Summary (Signed)
[facility] - [place] at [facility], 77 y.o., DOB [birth year], MRN [id number]. Admission date: 11/16/2017 Discharge Date 11/26/2017 Primary MD Center, [facility] Admitting Physician Altamese Dilling, MD  Admission Diagnosis  Elevated LFTs [R94.5] Sepsis, due to unspecified organism [facility]) [A41.9] Fever, unspecified fever cause [R50.9]  Discharge Diagnosis   Principal Problem: Sepsis (HCC) unclear source Acute encephalopathy Seizure Respiratory distress Elevated LFTs Acute on chronic renal insufficiency Essential hypertension Hyperlipidemia Failure to thrive Dementia      Hospital Course  Patient is a 77 year old brought into the hospital by family due to altered mental status and fevers.  Patient's work-up in the ER was negative including urinalysis chest x-ray and blood cultures.  Further work-up for fever was unrevealing.  Her fever stopped.  During hospitalization patient developed seizure and acute respiratory distress had to be moved to the ICU placed on BiPAP.  Which was subsequently taken off.  Patient was treated with IV Keppra next and then IV Depakote.  Had a lumbar puncture which was unrevealing.  MRI of the brain showed no evidence of stroke or any other abnormality.  Patient's EEG showed slow waves but no other abnormality.  Patient showing no significant improvement.  She is awake however unable to communicate effectively.  She also is aspirating with coughing.  Patient was seen by palliative care team as well as myself we discussed with family regarding further treatment.  It was decided no feeding tubes with no aggressive measures.  Patient will go to a nursing facility with the goal of comfort cares only.  She should not be brought back to the hospital.    Patient to continue oxygen as needed  Diet Dysphagia level 1 (PUREE) w/ NECTAR consistency liquids; aspiration precautions; Pills in Puree; feeding support at all  meals, if family chooses can do a regular diet for pleasure feeding      Consults  neurology, intensivist  Significant Tests:  See full reports for all details     Ct Head Wo Contrast  Result Date: 11/23/2017 CLINICAL DATA:  Confusion EXAM: CT HEAD WITHOUT CONTRAST TECHNIQUE: Contiguous axial images were obtained from the base of the skull through the vertex without intravenous contrast. COMPARISON:  MRI 11/18/2017.  CT 11/17/2017. FINDINGS: Brain: There is atrophy and chronic small vessel disease changes. No acute intracranial abnormality. Specifically, no hemorrhage, hydrocephalus, mass lesion, acute infarction, or significant intracranial injury. Dural calcifications noted along the tentorium and falx. Vascular: No hyperdense vessel or unexpected calcification. Skull: No acute calvarial abnormality. Sinuses/Orbits: Visualized paranasal sinuses and mastoids clear. Orbital soft tissues unremarkable. Other: None IMPRESSION: No acute intracranial abnormality. Atrophy, chronic microvascular disease. Electronically Signed   By: Charlett Nose M.D.   On: 11/23/2017 09:17   Ct Head Wo Contrast  Result Date: 11/17/2017 CLINICAL DATA:  77 y/o F; acute mental status change. Fever and suspected seizure. EXAM: CT HEAD WITHOUT CONTRAST TECHNIQUE: Contiguous axial images were obtained from the base of the skull through the vertex without intravenous contrast. COMPARISON:  11/16/2017 CT head FINDINGS: Brain: No evidence of acute infarction, hemorrhage, hydrocephalus, extra-axial collection or mass lesion/mass effect. Stable chronic microvascular ischemic changes and parenchymal volume loss of the brain. Vascular: Calcific atherosclerosis of carotid siphons. No hyperdense vessel. Skull: Normal. Negative for fracture or focal lesion. Sinuses/Orbits: No acute finding. Other: None. IMPRESSION: 1. No acute intracranial abnormality identified. 2. Stable chronic microvascular ischemic changes and parenchymal volume loss  of the brain. Electronically Signed   By: Micah Noel  Furusawa-Stratton M.D.   On: 11/17/2017 18:41   Ct Head Wo Contrast  Result Date: 11/16/2017 CLINICAL DATA:  Dementia. Altered mental status and progressive weakness. EXAM: CT HEAD WITHOUT CONTRAST TECHNIQUE: Contiguous axial images were obtained from the base of the skull through the vertex without intravenous contrast. COMPARISON:  08/05/2017 FINDINGS: Brain: Mild age related involutional changes of the brain with chronic minimal small vessel ischemia of periventricular and subcortical white matter. No acute intracranial hemorrhage, mass or midline shift. No large vascular territory infarct. No extra-axial fluid collections. Midline fourth ventricle and basal cisterns without effacement. The brainstem and cerebellum are stable. Calcifications along the tentorium are stable. Vascular: No hyperdense vessel sign. Skull: Intact Sinuses/Orbits: Nonacute Other: None IMPRESSION: Minimal chronic small vessel ischemic disease. No acute intracranial abnormality. Electronically Signed   By: Tollie Eth M.D.   On: 11/16/2017 21:12   Mr Brain Wo Contrast  Result Date: 11/18/2017 CLINICAL DATA:  77 y/o F; found down. History of dementia and hypertension. Sepsis. EXAM: MRI HEAD WITHOUT CONTRAST TECHNIQUE: Multiplanar, multiecho pulse sequences of the brain and surrounding structures were obtained without intravenous contrast. COMPARISON:  11/17/2017 CT head.  10/09/2015 MRI head. FINDINGS: Brain: Extensive motion degradation on multiple sequences. No reduced diffusion to suggest acute or early subacute infarction. Stable nonspecific foci of T2 FLAIR hyperintense signal abnormality in subcortical and periventricular white matter are compatible with mild chronic microvascular ischemic changes for age. Mild brain parenchymal volume loss. No focal mass effect, extra-axial collection, hydrocephalus, or herniation. Punctate stable focus of susceptibility hypointensity in left  cerebellar hemisphere compatible with chronic hemosiderin deposition from microhemorrhage. Vascular: Normal flow voids. Skull and upper cervical spine: Normal marrow signal. Sinuses/Orbits: Right maxillary sinus mucous retention cyst. No significant abnormal signal of mastoid air cells. Orbits are unremarkable. Other: None. IMPRESSION: 1. Motion degraded study. 2. No acute intracranial abnormality identified. 3. Stable mild chronic microvascular ischemic changes and parenchymal volume loss of the brain. Electronically Signed   By: Mitzi Hansen M.D.   On: 11/18/2017 14:33   Dg Chest Port 1 View  Result Date: 11/20/2017 CLINICAL DATA:  SOB per physician order. Pt admitted 11/16/2017 for sepsis and altered mental status likely metabolic encephalopathy. Hx - HTN, dementia, acute on chronic renal insufficiency, former smoker. EXAM: PORTABLE CHEST 1 VIEW COMPARISON:  11/17/2017 FINDINGS: Cardiac silhouette is normal in size. No mediastinal or hilar masses. No evidence of adenopathy. There are prominent bronchovascular markings accentuated by low lung volumes. No evidence of pneumonia or pulmonary edema. No pleural effusion or pneumothorax. Skeletal structures are demineralized but grossly intact IMPRESSION: No active disease. Electronically Signed   By: Amie Portland M.D.   On: 11/20/2017 14:46   Dg Chest Port 1 View  Result Date: 11/17/2017 CLINICAL DATA:  Fever, unresponsive. EXAM: PORTABLE CHEST 1 VIEW COMPARISON:  Radiograph of November 16, 2017. FINDINGS: The heart size and mediastinal contours are within normal limits. Atherosclerosis of thoracic aorta is noted. Hypoinflation of the lungs is noted with minimal bibasilar subsegmental atelectasis. The visualized skeletal structures are unremarkable. IMPRESSION: Hypoinflation of lungs with minimal bibasilar subsegmental atelectasis. Aortic Atherosclerosis (ICD10-I70.0). Electronically Signed   By: Lupita Raider, M.D.   On: 11/17/2017 18:07   Dg Chest  Port 1 View  Result Date: 11/16/2017 CLINICAL DATA:  77 y/o F; altered mental status. Abnormal breath sounds. Sepsis. EXAM: PORTABLE CHEST 1 VIEW COMPARISON:  None. FINDINGS: Normal cardiac silhouette. Aortic atherosclerosis with calcification. Mild bronchitic changes. No focal consolidation no pleural effusion or  pneumothorax. No acute osseous abnormality is evident. IMPRESSION: Mild bronchitic changes. No focal consolidation. Aortic atherosclerosis. Electronically Signed   By: Mitzi HansenLance  Furusawa-Stratton M.D.   On: 11/16/2017 17:27   [place] Abdomen Limited Ruq  Result Date: 11/16/2017 CLINICAL DATA:  Elevated liver function test with sepsis. EXAM: ULTRASOUND ABDOMEN LIMITED RIGHT UPPER QUADRANT COMPARISON:  None. FINDINGS: Gallbladder: The gallbladder is physiologically distended without focal mural thickening. Gallbladder wall is 2.2 mm in single wall thickness which is within normal limits. There is layering biliary sludge with a mobile 1.9 cm gallstone noted near the neck. No sonographic Murphy sign noted by sonographer. Common bile duct: Diameter: Ranged in size from 6.2 mm approximately the 10.4 mm distally. No choledocholithiasis. Liver: No focal lesion identified. Within normal limits in parenchymal echogenicity. Portal vein is patent on color Doppler imaging with normal direction of blood flow towards the liver. IMPRESSION: Uncomplicated cholelithiasis with biliary sludge. Electronically Signed   By: Tollie Ethavid  Kwon M.D.   On: 11/16/2017 19:25   Dg Fluoro Guided Loc Of Needle/cath Tip For Spinal Inject Lt  Result Date: 11/22/2017 CLINICAL DATA:  Confusion EXAM: DIAGNOSTIC LUMBAR PUNCTURE UNDER FLUOROSCOPIC GUIDANCE FLUOROSCOPY TIME:  Fluoroscopy Time:  0.4 minute Radiation Exposure Index (if provided by the fluoroscopic device): 5.9 mGy Number of Acquired Spot Images: 0 PROCEDURE: Informed consent was obtained from the patient prior to the procedure, including potential complications of headache, allergy, and  pain. With the patient prone, the lower back was prepped with Betadine. 1% Lidocaine was used for local anesthesia. Lumbar puncture was performed at the L3-4 level using a 22 gauge needle with return of clear CSF. 9 ml of CSF were obtained for laboratory studies. The patient tolerated the procedure well and there were no apparent complications. IMPRESSION: Successful fluoroscopic guided lumbar puncture. Electronically Signed   By: Elige KoHetal  Tangee Marszalek   On: 11/22/2017 09:47       Today   Subjective:   Hilary Hertzhelma Counihan patient remains similar unable to communicate effectively according to family coughs when fed  Objective:   Blood pressure (!) 147/63, pulse 70, temperature 97.6 F (36.4 C), temperature source Oral, resp. rate 20, height 5\' 5"  (1.651 m), weight 103.5 kg (228 lb 2 oz), SpO2 97 %.  .  Intake/Output Summary (Last 24 hours) at 11/26/2017 0834 Last data filed at 11/26/2017 0504 Gross per 24 hour  Intake 3151.23 ml  Output 1400 ml  Net 1751.23 ml    Exam VITAL SIGNS: Blood pressure (!) 147/63, pulse 70, temperature 97.6 F (36.4 C), temperature source Oral, resp. rate 20, height 5\' 5"  (1.651 m), weight 103.5 kg (228 lb 2 oz), SpO2 97 %.  GENERAL:  77 y.o.-year-old patient lying in the bed tonically ill-appearing obese EYES: Pupils equal, round, reactive to light and accommodation. No scleral icterus. Extraocular muscles intact.  HEENT: Head atraumatic, normocephalic. Oropharynx and nasopharynx clear.  NECK:  Supple, no jugular venous distention. No thyroid enlargement, no tenderness.  LUNGS: Normal breath sounds bilaterally, no wheezing, rales,rhonchi or crepitation. No use of accessory muscles of respiration.  CARDIOVASCULAR: S1, S2 normal. No murmurs, rubs, or gallops.  ABDOMEN: Soft, nontender, nondistended. Bowel sounds present. No organomegaly or mass.  EXTREMITIES: No pedal edema, cyanosis, or clubbing.  NEUROLOGIC: Unable to follow many commands  pSYCHIATRIC: The patient is  alert not oriented SKIN: No obvious rash, lesion, or ulcer.   Data Review     CBC w Diff:  Lab Results  Component Value Date   WBC 12.7 (H) 11/24/2017  HGB 11.7 (L) 11/24/2017   HCT 35.0 11/24/2017   PLT 181 11/24/2017   LYMPHOPCT 4 11/18/2017   MONOPCT 6 11/18/2017   EOSPCT 0 11/18/2017   BASOPCT 0 11/18/2017   CMP:  Lab Results  Component Value Date   NA 136 11/24/2017   K 5.2 (H) 11/24/2017   CL 110 11/24/2017   CO2 19 (L) 11/24/2017   BUN 41 (H) 11/24/2017   CREATININE 1.43 (H) 11/24/2017   PROT 6.7 11/21/2017   ALBUMIN 2.6 (L) 11/21/2017   BILITOT 1.8 (H) 11/21/2017   ALKPHOS 204 (H) 11/21/2017   AST 126 (H) 11/21/2017   ALT 201 (H) 11/21/2017  .  Micro Results Recent Results (from the past 240 hour(s))  Urine culture     Status: None   Collection Time: 11/16/17  4:48 PM  Result Value Ref Range Status   Specimen Description   Final    URINE, RANDOM Performed at [facility], [address], [place], [place] [address]    Special Requests   Final    NONE Performed at [facility], [address] [place], [place] [address]    Culture   Final    NO GROWTH Performed at [facility], [address], [place], [place] [address]    Report Status 11/17/2017 FINAL  Final  Culture, blood (Routine x 2)     Status: None   Collection Time: 11/16/17  4:57 PM  Result Value Ref Range Status   Specimen Description BLOOD LEFT FOREARM  Final   Special Requests   Final    BOTTLES DRAWN AEROBIC AND ANAEROBIC Blood Culture results may not be optimal due to an inadequate volume of blood received in culture bottles   Culture   Final    NO GROWTH 5 DAYS Performed at [facility], [address] [place], [place] [address]    Report Status 11/21/2017 FINAL  Final  Culture, blood (Routine x 2)     Status: None   Collection Time: 11/16/17  5:02 PM  Result Value Ref Range Status   Specimen Description BLOOD RIGHT FOREARM  Final    Special Requests   Final    BOTTLES DRAWN AEROBIC AND ANAEROBIC Blood Culture results may not be optimal due to an inadequate volume of blood received in culture bottles   Culture   Final    NO GROWTH 5 DAYS Performed at [facility], [address] [place], [place] [address]    Report Status 11/21/2017 FINAL  Final  Respiratory Panel by PCR     Status: None   Collection Time: 11/17/17  8:41 AM  Result Value Ref Range Status   Adenovirus NOT DETECTED NOT DETECTED Final   Coronavirus 229E NOT DETECTED NOT DETECTED Final   Coronavirus HKU1 NOT DETECTED NOT DETECTED Final   Coronavirus NL63 NOT DETECTED NOT DETECTED Final   Coronavirus OC43 NOT DETECTED NOT DETECTED Final   Metapneumovirus NOT DETECTED NOT DETECTED Final   Rhinovirus / Enterovirus NOT DETECTED NOT DETECTED Final   Influenza A NOT DETECTED NOT DETECTED Final   Influenza B NOT DETECTED NOT DETECTED Final   Parainfluenza Virus 1 NOT DETECTED NOT DETECTED Final   Parainfluenza Virus 2 NOT DETECTED NOT DETECTED Final   Parainfluenza Virus 3 NOT DETECTED NOT DETECTED Final   Parainfluenza Virus 4 NOT DETECTED NOT DETECTED Final   Respiratory Syncytial Virus NOT DETECTED NOT DETECTED Final   Bordetella pertussis NOT DETECTED NOT DETECTED Final  Chlamydophila pneumoniae NOT DETECTED NOT DETECTED Final   Mycoplasma pneumoniae NOT DETECTED NOT DETECTED Final    Comment: Performed at [facility], [address] [place], [place] [address]  MRSA PCR Screening     Status: None   Collection Time: 11/17/17  6:54 PM  Result Value Ref Range Status   MRSA by PCR NEGATIVE NEGATIVE Final    Comment:        The GeneXpert MRSA Assay (FDA approved for NASAL specimens only), is one component of a comprehensive MRSA colonization surveillance program. It is not intended to diagnose MRSA infection nor to guide or monitor treatment for MRSA infections. Performed at [facility], [address]  [place], [place] [address]   CSF culture     Status: None   Collection Time: 11/22/17  9:28 AM  Result Value Ref Range Status   Specimen Description   Final    CSF Performed at [facility], [address], [place], [place] [address]    Special Requests   Final    NONE Performed at [facility], [address] [place], [place] [address]    Gram Stain   Final    NO ORGANISMS SEEN RARE RED BLOOD CELLS FEW WBC SEEN Performed at [facility], [address] [place], [place] [address]    Culture   Final    NO GROWTH 3 DAYS Performed at [facility], [address], [place], [place] [address]    Report Status 11/25/2017 FINAL  Final  Culture, fungus without smear     Status: None (Preliminary result)   Collection Time: 11/22/17  9:28 AM  Result Value Ref Range Status   Specimen Description   Final    CSF Performed at [facility], [address] [place], [place] [address]    Special Requests   Final    NONE Performed at [facility], [address], [place], [place] [address]    Culture   Final    NO FUNGUS ISOLATED AFTER 3 DAYS Performed at [facility], [address] [place], [place] [address]    Report Status PENDING  Incomplete        Code Status Orders  (From admission, onward)        Start     Ordered   11/16/17 2220  Do not attempt resuscitation (DNR)  Continuous    Question Answer Comment  In the event of cardiac or respiratory ARREST Do not call a "code blue"   In the event of cardiac or respiratory ARREST Do not perform Intubation, CPR, defibrillation or ACLS   In the event of cardiac or respiratory ARREST Use medication by any route, position, wound care, and other measures to relive pain and suffering. May use oxygen, suction and manual treatment of airway obstruction as needed for comfort.      11/16/17 2219    Code Status History    This patient has a current code status but no historical  code status.          Contact information for after-discharge care    Destination    [facility] Preferred SNF .   Service:  Skilled Nursing Contact information: [address] [place] [address] [phone]              Discharge Medications   Allergies as of 11/26/2017   No Known Allergies     Medication List  STOP taking these medications   aspirin EC 81 MG tablet   atorvastatin 10 MG tablet Commonly known as:  LIPITOR   CERTAVITE SENIOR/ANTIOXIDANT Tabs   cyanocobalamin 1000 MCG tablet   D3-1000 1000 units tablet Generic drug:  Cholecalciferol   QUEtiapine 25 MG tablet Commonly known as:  SEROQUEL     TAKE these medications   galantamine 12 MG tablet Commonly known as:  RAZADYNE Take 12 mg by mouth 2 (two) times daily.   LORazepam 0.5 MG tablet Commonly known as:  ATIVAN Take 1 tablet (0.5 mg total) by mouth every 8 (eight) hours as needed for anxiety.   memantine 10 MG tablet Commonly known as:  NAMENDA Take 10 mg by mouth 2 (two) times daily.   modafinil 200 MG tablet Commonly known as:  PROVIGIL Take 1 tablet (200 mg total) by mouth daily.   morphine 20 MG/5ML solution Take 0.6 mLs (2.4 mg total) by mouth every 2 (two) hours as needed for pain.   scopolamine 1 MG/3DAYS Commonly known as:  TRANSDERM-SCOP Place 1 patch (1.5 mg total) onto the skin every 3 (three) days.   valproic acid 250 MG capsule Commonly known as:  DEPAKENE Take 4 capsules (1,000 mg total) by mouth 2 (two) times daily.          Total Time in preparing paper work, data evaluation and todays exam - 35 minutes  Auburn Bilberry M.D on 11/26/2017 at 8:34 AM [facility]   Office  [phone]

## 2017-11-26 NOTE — Plan of Care (Signed)
  Problem: Clinical Measurements: Goal: Ability to maintain clinical measurements within normal limits will improve Outcome: Progressing Goal: Will remain free from infection Outcome: Progressing Goal: Diagnostic test results will improve Outcome: Progressing Goal: Cardiovascular complication will be avoided Outcome: Progressing   Problem: Nutrition: Goal: Adequate nutrition will be maintained Outcome: Progressing   Problem: Coping: Goal: Level of anxiety will decrease Outcome: Progressing   Problem: Elimination: Goal: Will not experience complications related to bowel motility Outcome: Progressing Goal: Will not experience complications related to urinary retention Outcome: Progressing   Problem: Pain Managment: Goal: General experience of comfort will improve Outcome: Progressing   Problem: Safety: Goal: Ability to remain free from injury will improve Outcome: Progressing   Problem: Skin Integrity: Goal: Risk for impaired skin integrity will decrease Outcome: Progressing   

## 2017-11-29 LAB — BLOOD GAS, ARTERIAL
Acid-Base Excess: 1.5 mmol/L (ref 0.0–2.0)
Allens test (pass/fail): POSITIVE — AB
BICARBONATE: 23.9 mmol/L (ref 20.0–28.0)
FIO2: 0.21
O2 Saturation: 93.1 %
PCO2 ART: 30 mmHg — AB (ref 32.0–48.0)
PH ART: 7.51 — AB (ref 7.350–7.450)
Patient temperature: 37
pO2, Arterial: 60 mmHg — ABNORMAL LOW (ref 83.0–108.0)

## 2017-11-29 LAB — CYTOLOGY - NON PAP

## 2017-11-30 ENCOUNTER — Ambulatory Visit: Admit: 2017-11-30 | Payer: PRIVATE HEALTH INSURANCE | Admitting: Ophthalmology

## 2017-11-30 SURGERY — PHACOEMULSIFICATION, CATARACT, WITH IOL INSERTION
Anesthesia: Choice | Laterality: Right

## 2017-12-13 LAB — CULTURE, FUNGUS WITHOUT SMEAR

## 2018-01-09 DEATH — deceased

## 2019-07-16 IMAGING — CT CT HEAD W/O CM
3 series · 14 of 47 positions shown, 16 images · non-contrast
Comparison: 11/16/2017 CT head

CLINICAL DATA: 76 y/o F; acute mental status change. Fever and
suspected seizure.

EXAM:
CT HEAD WITHOUT CONTRAST
TECHNIQUE: Contiguous axial images were obtained from the base of the skull
through the vertex without intravenous contrast.

[Series 2: head wo · axial · 0.47mm/px · z∈[+356,+481]mm · 8 of 31 slices shown, 10 images]
[im 3/31  brain]
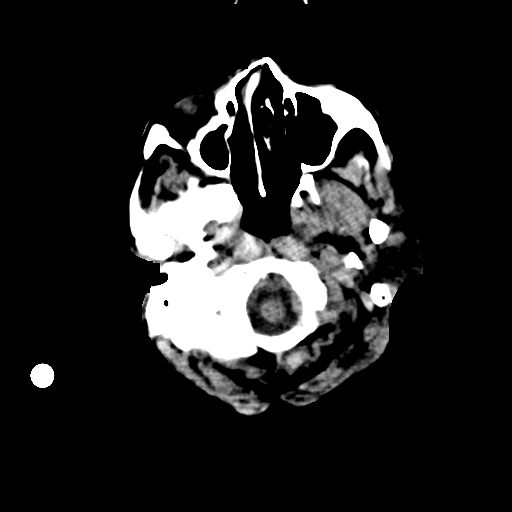
[im 3/31  bone]
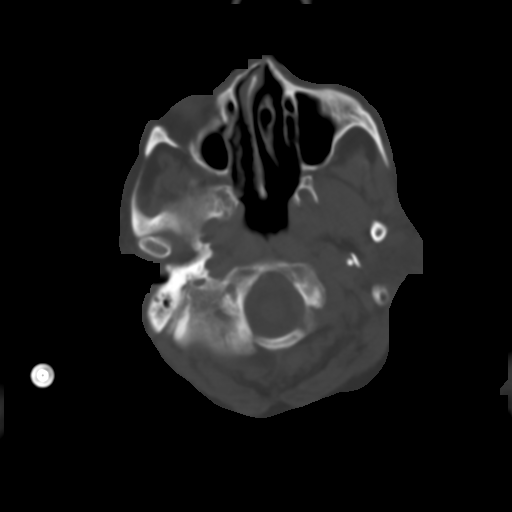
[im 7/31  brain]
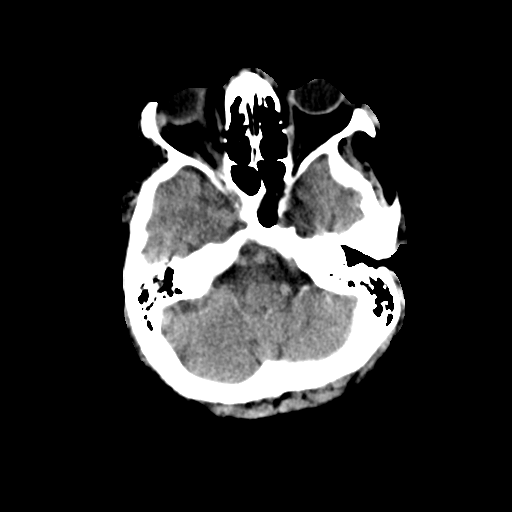
[im 10/31  brain]
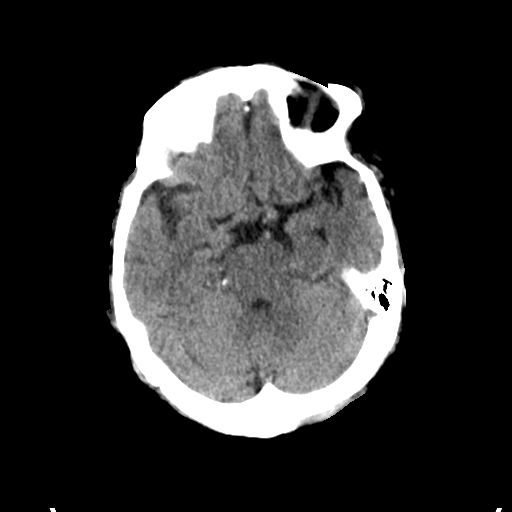
[im 14/31  brain]
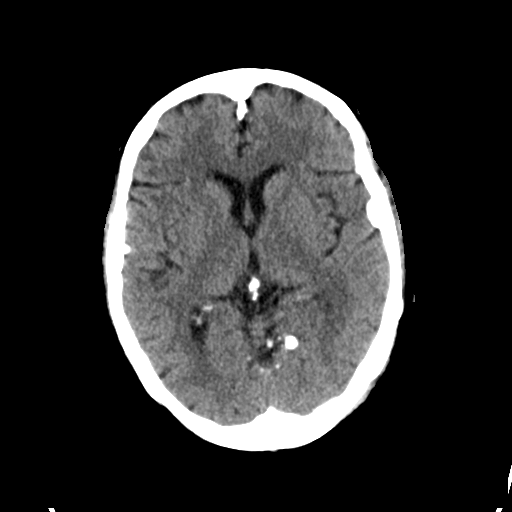
[im 17/31  brain]
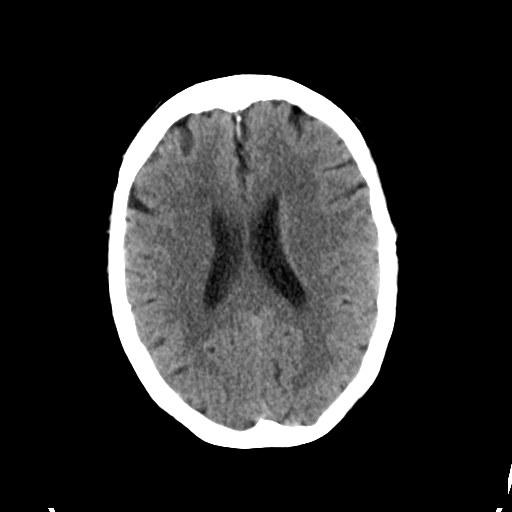
[im 17/31  bone]
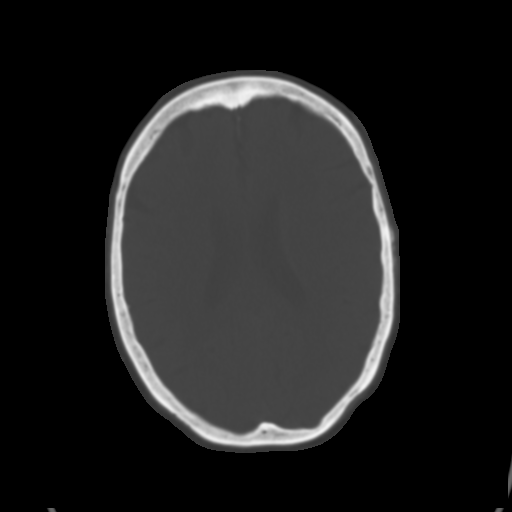
[im 21/31  brain]
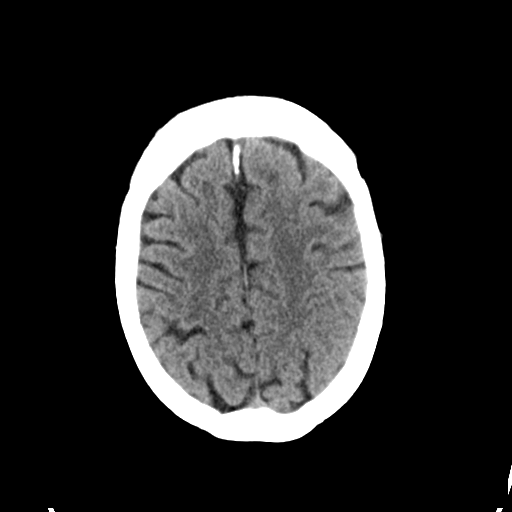
[im 24/31  brain]
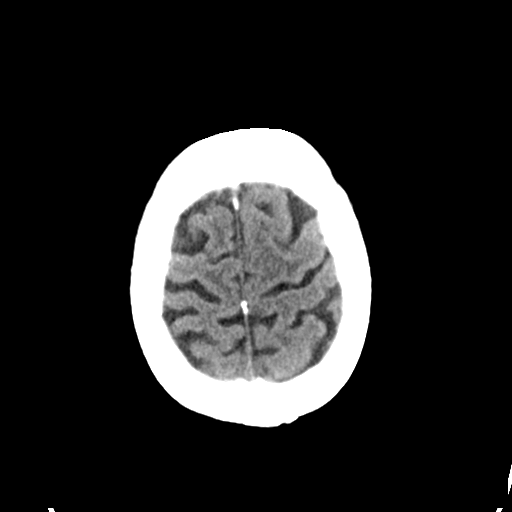
[im 28/31  brain]
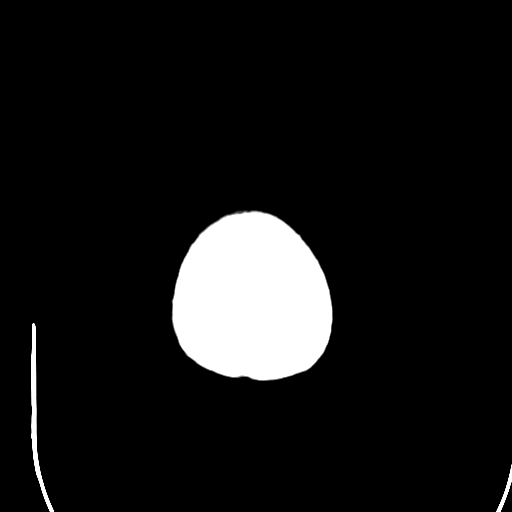

[Series 4: coronal soft tissue · coronal · 0.27mm/px · 3 of 63 slices shown]
[im 21/63  brain]
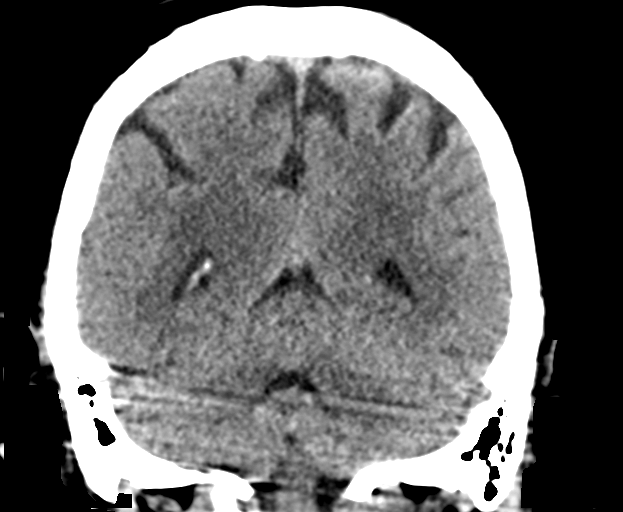
[im 28/63  brain]
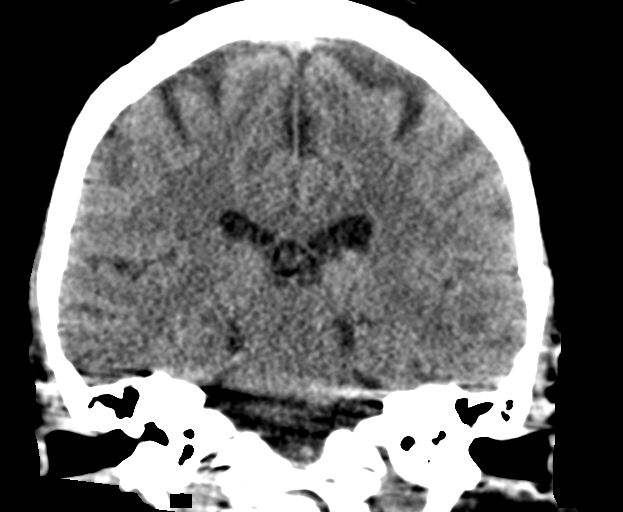
[im 35/63  brain]
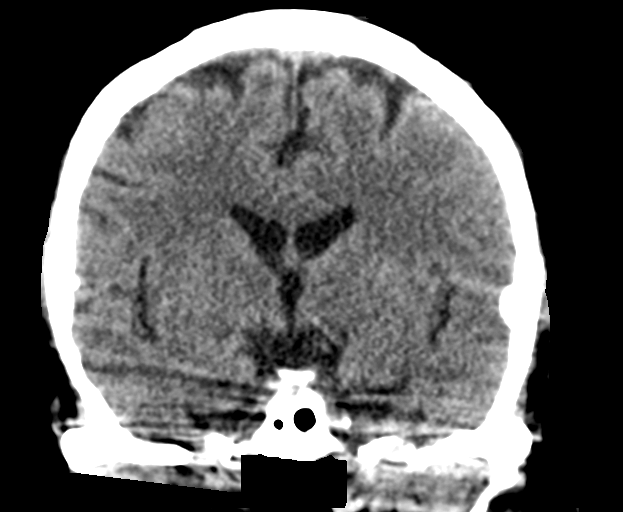

[Series 5: sagittal soft tissue · sagittal · 0.30mm/px · 3 of 49 slices shown]
[im 17/49  brain]
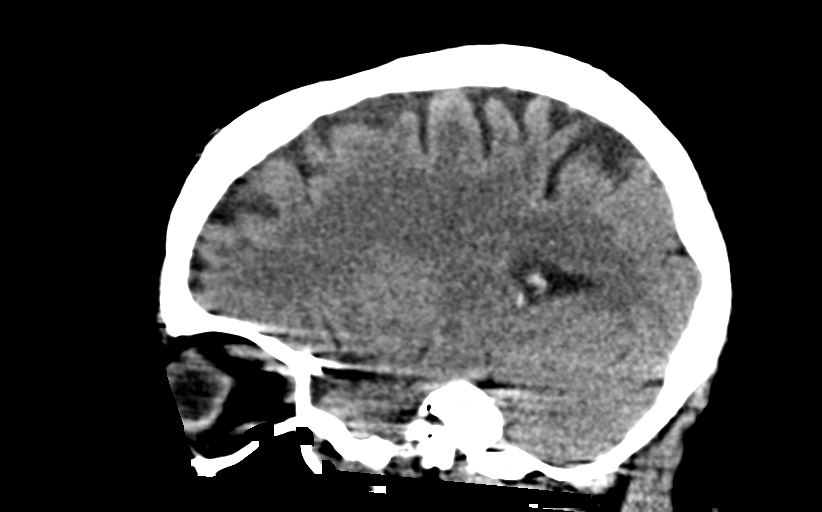
[im 25/49  brain]
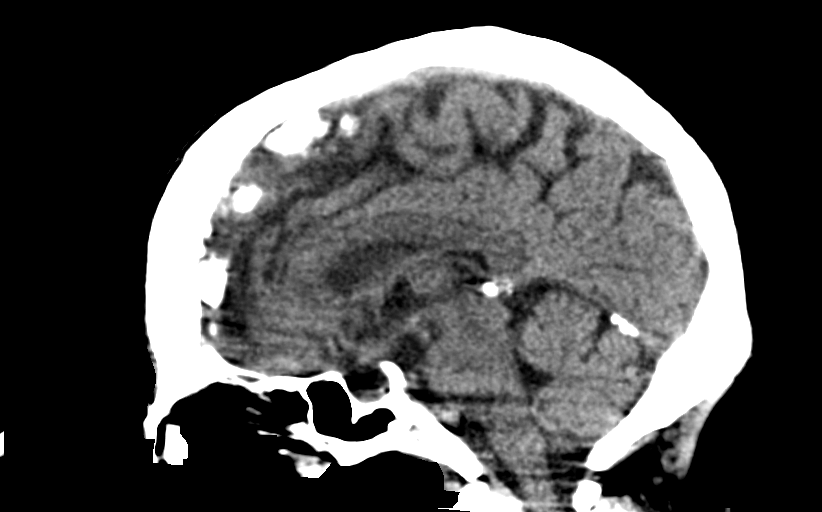
[im 33/49  brain]
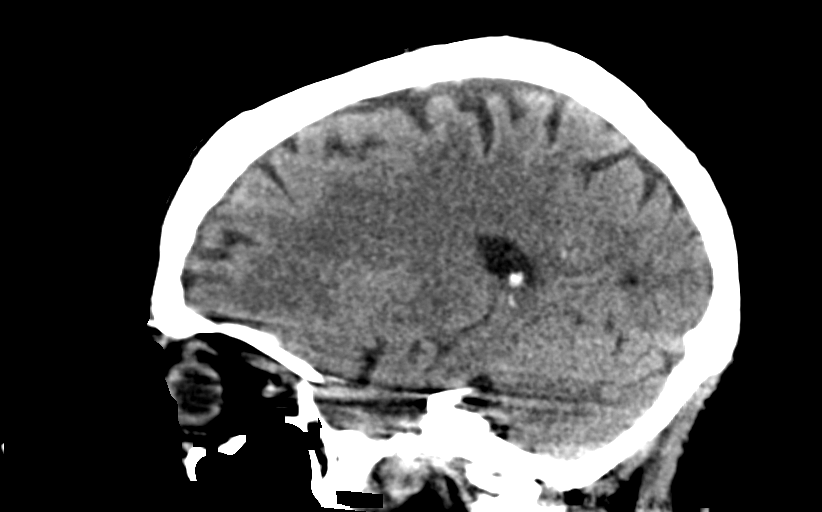

[14 of 47 positions shown; findings below may reference images not displayed]

FINDINGS: Brain: No evidence of acute infarction, hemorrhage, hydrocephalus,
extra-axial collection or mass lesion/mass effect. Stable chronic
microvascular ischemic changes and parenchymal volume loss of the
brain.

Vascular: Calcific atherosclerosis of carotid siphons. No hyperdense
vessel.

Skull: Normal. Negative for fracture or focal lesion.

Sinuses/Orbits: No acute finding.

Other: None.
IMPRESSION: 1. No acute intracranial abnormality identified.
2. Stable chronic microvascular ischemic changes and parenchymal
volume loss of the brain.

By: Nadav Sidon M.D.

## 2019-07-16 IMAGING — DX DG CHEST 1V PORT
1 series · 1 of 1 positions shown · non-contrast
Comparison: Radiograph November 16, 2017.

CLINICAL DATA: Fever, unresponsive.

EXAM:
PORTABLE CHEST 1 VIEW

[chest ap]
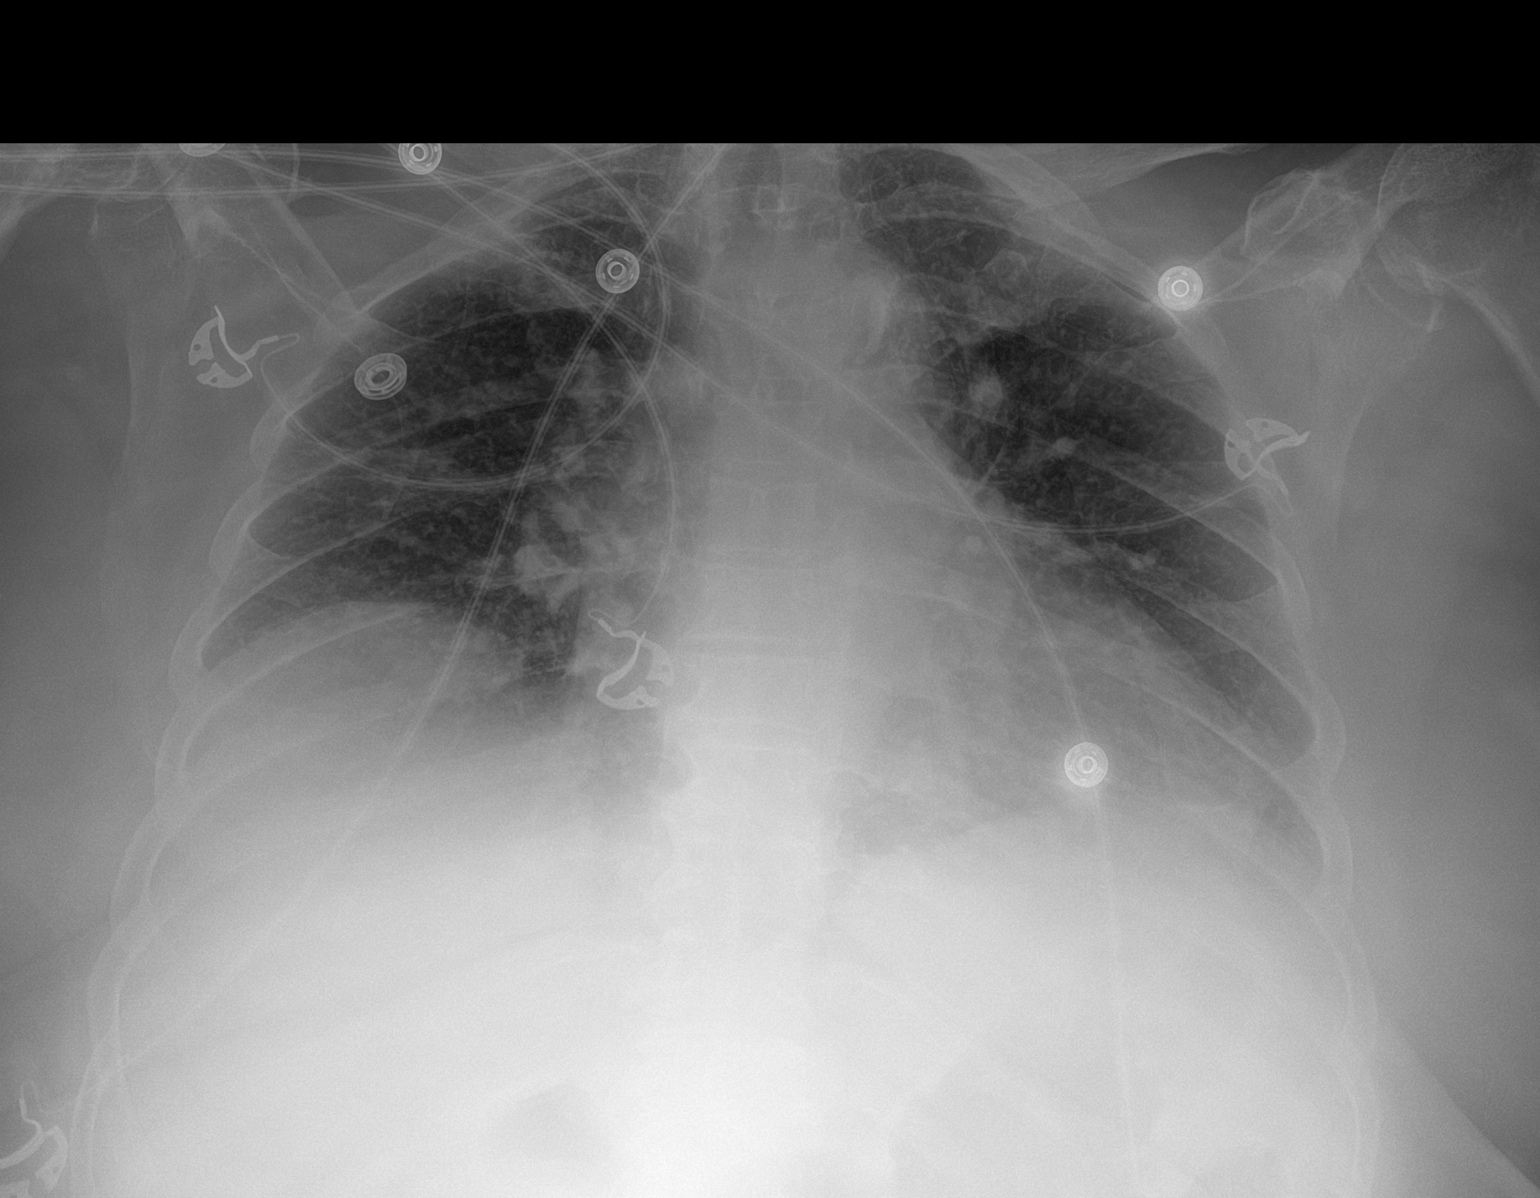

[1 of 1 positions shown; findings below may reference images not displayed]

FINDINGS: The heart size and mediastinal contours are within normal limits.
Atherosclerosis of thoracic aorta is noted. Hypoinflation of the
lungs is noted with minimal bibasilar subsegmental atelectasis. The
visualized skeletal structures are unremarkable.
IMPRESSION: Hypoinflation of lungs with minimal bibasilar subsegmental
atelectasis.

Aortic Atherosclerosis ([id number]).

## 2019-07-19 IMAGING — DX DG CHEST 1V PORT
1 series · 1 of 1 positions shown · non-contrast
Comparison: 11/17/2017

CLINICAL DATA: SOB per physician order. Pt admitted 11/16/2017 for
sepsis and altered mental status likely metabolic encephalopathy. Hx
- HTN, dementia, acute on chronic renal insufficiency, former
smoker.

EXAM:
PORTABLE CHEST 1 VIEW

[chest ap]
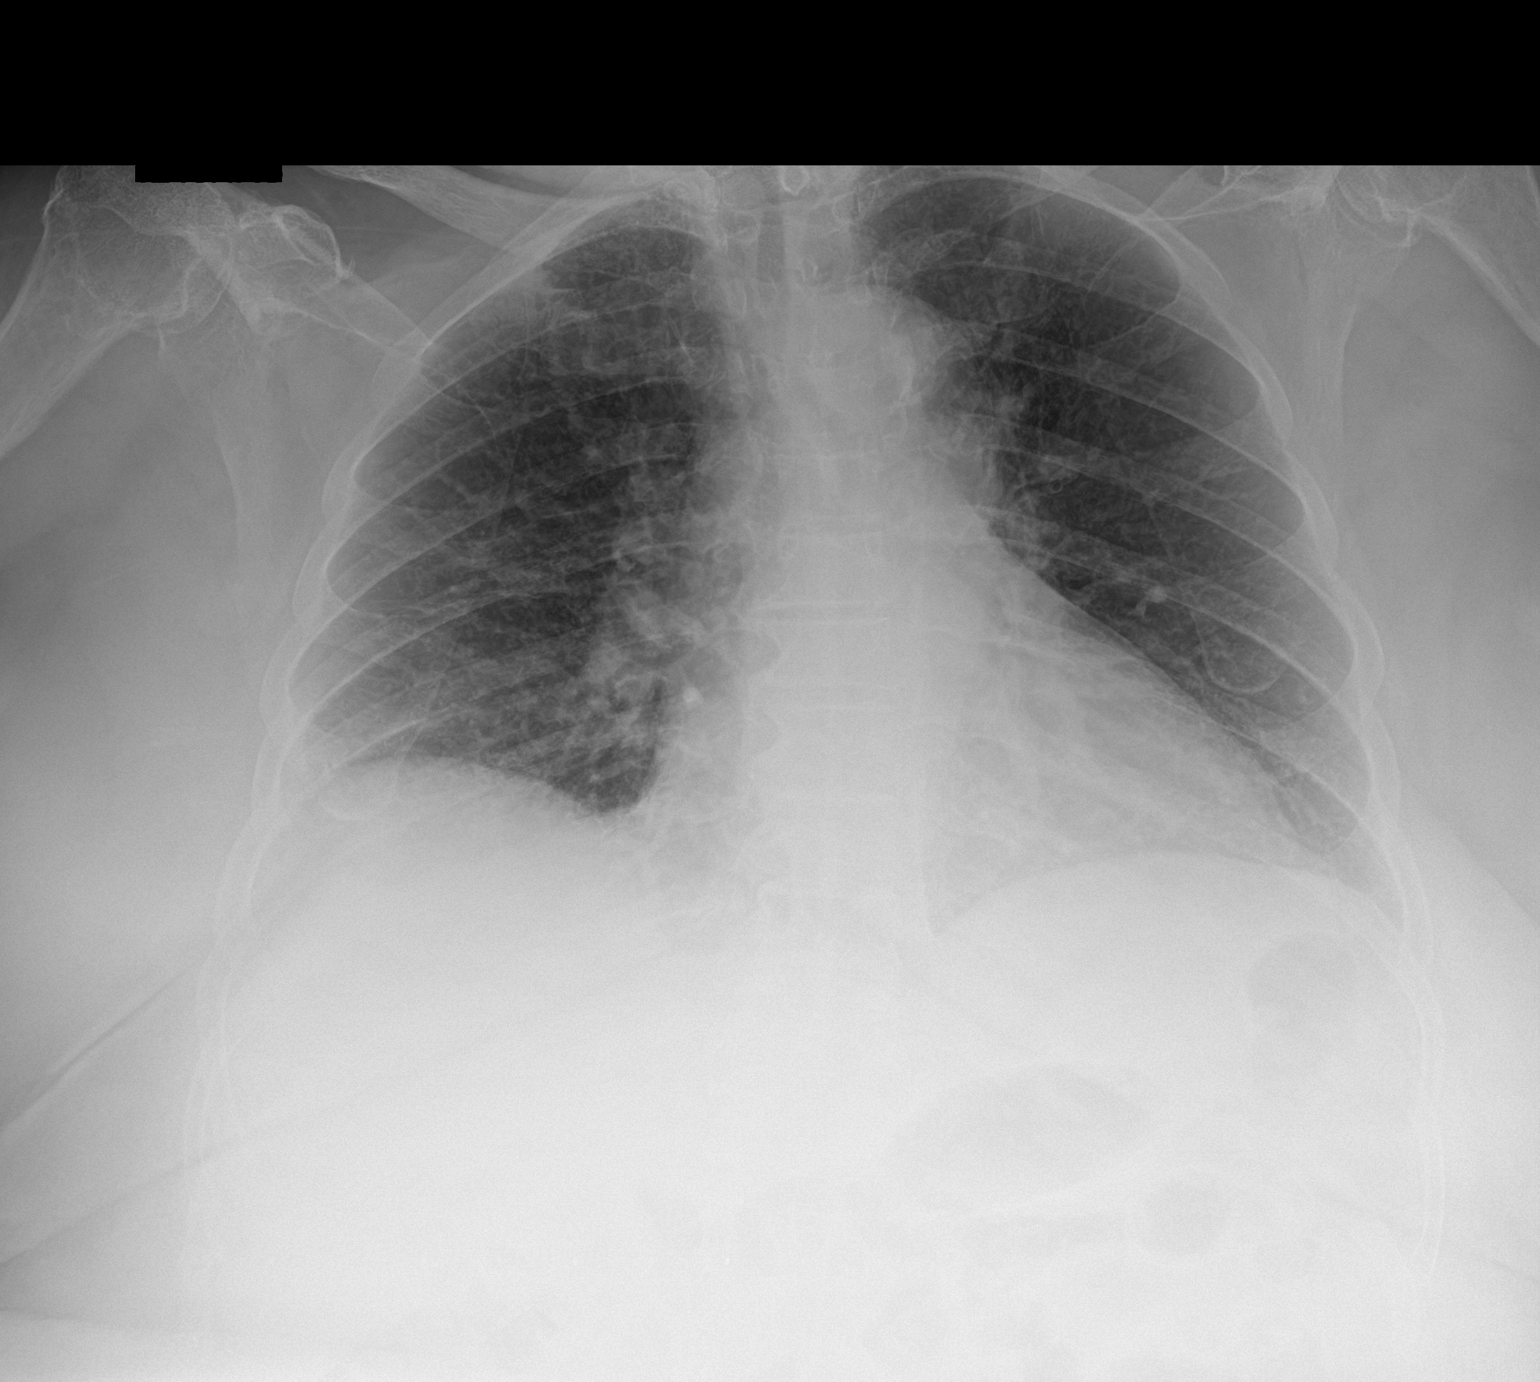

[1 of 1 positions shown; findings below may reference images not displayed]

FINDINGS: Cardiac silhouette is normal in size. No mediastinal or hilar
masses. No evidence of adenopathy.

There are prominent bronchovascular markings accentuated by low lung
volumes. No evidence of pneumonia or pulmonary edema. No pleural
effusion or pneumothorax.

Skeletal structures are demineralized but grossly intact
IMPRESSION: No active disease.
